# Patient Record
Sex: Female | Born: 1955 | Race: White | Hispanic: No | Marital: Married | Smoking: Never smoker
Health system: Southern US, Community
[De-identification: ages and names within clinical notes are randomized; demographics above are authoritative.]

## PROBLEM LIST (undated history)

## (undated) DIAGNOSIS — M4310 Spondylolisthesis, site unspecified: Secondary | ICD-10-CM

## (undated) DIAGNOSIS — J302 Other seasonal allergic rhinitis: Secondary | ICD-10-CM

## (undated) DIAGNOSIS — M8580 Other specified disorders of bone density and structure, unspecified site: Secondary | ICD-10-CM

## (undated) DIAGNOSIS — K219 Gastro-esophageal reflux disease without esophagitis: Secondary | ICD-10-CM

## (undated) DIAGNOSIS — F419 Anxiety disorder, unspecified: Secondary | ICD-10-CM

## (undated) DIAGNOSIS — E559 Vitamin D deficiency, unspecified: Secondary | ICD-10-CM

## (undated) DIAGNOSIS — E785 Hyperlipidemia, unspecified: Secondary | ICD-10-CM

## (undated) DIAGNOSIS — Z86010 Personal history of colonic polyps: Principal | ICD-10-CM

## (undated) HISTORY — DX: Hyperlipidemia, unspecified: E78.5

## (undated) HISTORY — DX: Gastro-esophageal reflux disease without esophagitis: K21.9

## (undated) HISTORY — PX: TUBAL LIGATION: SHX77

## (undated) HISTORY — DX: Vitamin D deficiency, unspecified: E55.9

## (undated) HISTORY — DX: Other seasonal allergic rhinitis: J30.2

## (undated) HISTORY — DX: Other specified disorders of bone density and structure, unspecified site: M85.80

## (undated) HISTORY — DX: Spondylolisthesis, site unspecified: M43.10

## (undated) HISTORY — PX: WISDOM TOOTH EXTRACTION: SHX21

## (undated) HISTORY — PX: COLONOSCOPY: SHX174

## (undated) HISTORY — DX: Personal history of colonic polyps: Z86.010

---

## 1985-07-10 HISTORY — PX: GYNECOLOGIC CRYOSURGERY: SHX857

## 1988-07-10 HISTORY — PX: TUBAL LIGATION: SHX77

## 1997-10-08 ENCOUNTER — Other Ambulatory Visit: Admission: RE | Admit: 1997-10-08 | Discharge: 1997-10-08 | Payer: Self-pay | Admitting: Gynecology

## 1998-09-27 ENCOUNTER — Encounter: Payer: Self-pay | Admitting: Orthopedic Surgery

## 1998-09-27 ENCOUNTER — Ambulatory Visit (HOSPITAL_COMMUNITY): Admission: RE | Admit: 1998-09-27 | Discharge: 1998-09-27 | Payer: Self-pay | Admitting: Orthopedic Surgery

## 1999-09-06 ENCOUNTER — Other Ambulatory Visit: Admission: RE | Admit: 1999-09-06 | Discharge: 1999-09-06 | Payer: Self-pay | Admitting: Gynecology

## 2000-07-10 HISTORY — PX: BREAST LUMPECTOMY: SHX2

## 2000-07-10 HISTORY — PX: BREAST EXCISIONAL BIOPSY: SUR124

## 2001-01-22 ENCOUNTER — Other Ambulatory Visit: Admission: RE | Admit: 2001-01-22 | Discharge: 2001-01-22 | Payer: Self-pay | Admitting: Gynecology

## 2001-01-28 ENCOUNTER — Encounter (INDEPENDENT_AMBULATORY_CARE_PROVIDER_SITE_OTHER): Payer: Self-pay | Admitting: Specialist

## 2001-01-28 ENCOUNTER — Other Ambulatory Visit: Admission: RE | Admit: 2001-01-28 | Discharge: 2001-01-28 | Payer: Self-pay | Admitting: Gynecology

## 2001-02-15 ENCOUNTER — Encounter: Payer: Self-pay | Admitting: Surgery

## 2001-02-15 ENCOUNTER — Encounter: Admission: RE | Admit: 2001-02-15 | Discharge: 2001-02-15 | Payer: Self-pay | Admitting: General Surgery

## 2001-02-25 ENCOUNTER — Encounter: Payer: Self-pay | Admitting: Surgery

## 2001-02-25 ENCOUNTER — Encounter (INDEPENDENT_AMBULATORY_CARE_PROVIDER_SITE_OTHER): Payer: Self-pay | Admitting: Specialist

## 2001-02-25 ENCOUNTER — Ambulatory Visit (HOSPITAL_BASED_OUTPATIENT_CLINIC_OR_DEPARTMENT_OTHER): Admission: RE | Admit: 2001-02-25 | Discharge: 2001-02-25 | Payer: Self-pay | Admitting: Surgery

## 2001-02-25 ENCOUNTER — Encounter: Admission: RE | Admit: 2001-02-25 | Discharge: 2001-02-25 | Payer: Self-pay | Admitting: Surgery

## 2002-01-23 ENCOUNTER — Other Ambulatory Visit: Admission: RE | Admit: 2002-01-23 | Discharge: 2002-01-23 | Payer: Self-pay | Admitting: Gynecology

## 2002-03-14 ENCOUNTER — Encounter: Payer: Self-pay | Admitting: Gynecology

## 2002-03-14 ENCOUNTER — Encounter: Admission: RE | Admit: 2002-03-14 | Discharge: 2002-03-14 | Payer: Self-pay | Admitting: Gynecology

## 2002-10-23 ENCOUNTER — Encounter: Payer: Self-pay | Admitting: Family Medicine

## 2002-10-23 ENCOUNTER — Encounter: Admission: RE | Admit: 2002-10-23 | Discharge: 2002-10-23 | Payer: Self-pay | Admitting: Family Medicine

## 2003-02-09 ENCOUNTER — Other Ambulatory Visit: Admission: RE | Admit: 2003-02-09 | Discharge: 2003-02-09 | Payer: Self-pay | Admitting: Gynecology

## 2003-05-29 ENCOUNTER — Encounter: Admission: RE | Admit: 2003-05-29 | Discharge: 2003-05-29 | Payer: Self-pay | Admitting: Gynecology

## 2004-05-23 ENCOUNTER — Other Ambulatory Visit: Admission: RE | Admit: 2004-05-23 | Discharge: 2004-05-23 | Payer: Self-pay | Admitting: Gynecology

## 2004-07-13 ENCOUNTER — Encounter: Admission: RE | Admit: 2004-07-13 | Discharge: 2004-07-13 | Payer: Self-pay | Admitting: Gynecology

## 2005-07-28 ENCOUNTER — Encounter: Admission: RE | Admit: 2005-07-28 | Discharge: 2005-07-28 | Payer: Self-pay | Admitting: Gynecology

## 2006-08-02 ENCOUNTER — Encounter: Admission: RE | Admit: 2006-08-02 | Discharge: 2006-08-02 | Payer: Self-pay | Admitting: Gynecology

## 2006-11-26 ENCOUNTER — Ambulatory Visit (HOSPITAL_COMMUNITY): Admission: RE | Admit: 2006-11-26 | Discharge: 2006-11-26 | Payer: Self-pay | Admitting: *Deleted

## 2007-08-06 ENCOUNTER — Encounter: Admission: RE | Admit: 2007-08-06 | Discharge: 2007-08-06 | Payer: Self-pay | Admitting: Gynecology

## 2008-08-12 ENCOUNTER — Encounter: Admission: RE | Admit: 2008-08-12 | Discharge: 2008-08-12 | Payer: Self-pay | Admitting: Gynecology

## 2009-09-09 ENCOUNTER — Encounter: Admission: RE | Admit: 2009-09-09 | Discharge: 2009-09-09 | Payer: Self-pay | Admitting: Gynecology

## 2010-08-22 ENCOUNTER — Other Ambulatory Visit: Payer: Self-pay | Admitting: Gynecology

## 2010-08-22 DIAGNOSIS — Z1231 Encounter for screening mammogram for malignant neoplasm of breast: Secondary | ICD-10-CM

## 2010-09-22 ENCOUNTER — Ambulatory Visit
Admission: RE | Admit: 2010-09-22 | Discharge: 2010-09-22 | Disposition: A | Discharge status: Home / Self Care | Payer: BC Managed Care – PPO | Source: Ambulatory Visit | Attending: Gynecology | Admitting: Gynecology

## 2010-09-22 DIAGNOSIS — Z1231 Encounter for screening mammogram for malignant neoplasm of breast: Secondary | ICD-10-CM

## 2010-11-22 NOTE — Op Note (Signed)
Grace Morales, Grace Morales         ACCOUNT NO.:  [id number]   MEDICAL RECORD NO.:  [id number]          PATIENT TYPE:  AMB   LOCATION:  ENDO                         FACILITY:  [facility]   PHYSICIAN:  Georgiana Spinner, M.D.    DATE OF BIRTH:  1956/06/07   DATE OF PROCEDURE:  DATE OF DISCHARGE:                               OPERATIVE REPORT   PROCEDURE:  Colonoscopy.   INDICATIONS:  Colon cancer screening.   ANESTHESIA:  Fentanyl 150 mcg, 14 mg, 25 mg of Benadryl.   PROCEDURE IN DETAIL:  With the patient mildly sedated in the left  lateral decubitus position, the Pentax videoscopic colonoscope was  inserted into the rectum and passed under direct vision to the cecum  identified by ileocecal valve and appendiceal orifice, both of which  were photographed from this point.  The colonoscope was slowly  withdrawn, taking circumferential views of colonic mucosa, stopping only  in the rectum which appeared normal on direct and showed hemorrhoids on  retroflexed view.  The endoscope was straightened and withdrawn.  The  patient's vital signs and pulse oximeter remained stable.  The patient  tolerated the procedure well without apparent complications.   FINDINGS:  Internal hemorrhoids, otherwise unremarkable examination.   PLAN:  Have the patient follow-up with me as an outpatient in five years  or as needed.           ______________________________  Georgiana Spinner, M.D.     GMO/MEDQ  D:  11/26/2006  T:  11/26/2006  Job:  [id number]

## 2010-11-25 NOTE — Op Note (Signed)
[place]  Patient:    Grace Morales Visit Number: [id number] MRN: [id number]          Service Type: DSU Location: [facility] Attending Physician:  Charlton Haws Proc. Date: 02/24/01 Adm. Date:  [id number]   CC:         [name], M.D.  [facility], Elnita Maxwell A. Radene Journey, M.D.  [facility]   Operative Report  ACCOUNT NO:  [id number]  CCS#:  [id number]  PREOPERATIVE DIAGNOSIS:  Right breast mass.  POSTOPERATIVE DIAGNOSIS:  Right breast mass.  CLINICAL HISTORY:  The patient is a 55 year old with a recently found abnormal mammogram showing an abnormal lesion subareolarly.  It is not thought to be appropriate for core biopsy by the radiologist.  Needle guided excisional biopsy was scheduled.  DESCRIPTION OF PROCEDURE:  The patient was brought to the operating room having had a guidewire placed into the breast.  The lesion spot was marked with an X by the radiologist which was at the 12 oclock position at the areolar margin.  The needle entered about 7 cm from here and about the middle lateral position of the breast and tracked in this general direction.  After satisfactory general anesthesia had been obtained the breast was prepped and draped.  I made a curvilinear incision at the areolar margin from about the 1 oclock to about the 5 oclock position, divided subcutaneous tissues, raised a little bit of a lateral skin flap and then began dividing straight down through breast tissue until I encountered the guidewire as it was tracking through the breast itself.  The guidewire was then manipulated into the wound.  It began to come a little under the guidewire heading medially towards the lesion and I was able to grasp the track of the guidewire with some Allis clamps and excise a core tissue or cylinder around the guidewire. In the subareolar area I did see the lesion itself and I was fairly close to it but it was soft and I  thought most likely to be benign.  Given its location I did feel we could get an adequate wide local excision if it were malignant without doing a subareolar excision and therefore elected to take no further tissue at this time.  The specimen was sent for specimen mammography and a subsequent return to the lesion had been retrieve.  The breast was irrigated and checked for hemostasis and once that was achieved closed with 3-0 Vicryl followed by 4-0 monacryl subcuticular and Steri-Strips on the skin.  I did inject some Marcaine prior to closing to help with postoperative analgesia.  The patient tolerated the procedure well.  There were no complications.  All counts were correct. DD:  02/26/00 TD:  02/26/01 Job: [id number]

## 2011-09-06 ENCOUNTER — Other Ambulatory Visit: Payer: Self-pay | Admitting: Obstetrics & Gynecology

## 2011-09-06 DIAGNOSIS — Z1231 Encounter for screening mammogram for malignant neoplasm of breast: Secondary | ICD-10-CM

## 2011-10-04 ENCOUNTER — Ambulatory Visit
Admission: RE | Admit: 2011-10-04 | Discharge: 2011-10-04 | Disposition: A | Discharge status: Home / Self Care | Payer: BC Managed Care – PPO | Source: Ambulatory Visit | Attending: Obstetrics & Gynecology | Admitting: Obstetrics & Gynecology

## 2011-10-04 DIAGNOSIS — Z1231 Encounter for screening mammogram for malignant neoplasm of breast: Secondary | ICD-10-CM

## 2012-09-25 ENCOUNTER — Encounter (HOSPITAL_BASED_OUTPATIENT_CLINIC_OR_DEPARTMENT_OTHER): Payer: Self-pay

## 2012-09-25 ENCOUNTER — Emergency Department (HOSPITAL_BASED_OUTPATIENT_CLINIC_OR_DEPARTMENT_OTHER)
Admission: EM | Admit: 2012-09-25 | Discharge: 2012-09-25 | Disposition: A | Discharge status: Home / Self Care | Payer: BC Managed Care – PPO | Attending: Emergency Medicine | Admitting: Emergency Medicine

## 2012-09-25 DIAGNOSIS — Z79899 Other long term (current) drug therapy: Secondary | ICD-10-CM | POA: Insufficient documentation

## 2012-09-25 DIAGNOSIS — F411 Generalized anxiety disorder: Secondary | ICD-10-CM | POA: Insufficient documentation

## 2012-09-25 DIAGNOSIS — Z203 Contact with and (suspected) exposure to rabies: Secondary | ICD-10-CM | POA: Insufficient documentation

## 2012-09-25 HISTORY — DX: Anxiety disorder, unspecified: F41.9

## 2012-09-25 MED ORDER — RABIES VACCINE, PCEC IM SUSR
1.0000 mL | Freq: Once | INTRAMUSCULAR | Status: AC
  Administered 2012-09-25: 1 mL via INTRAMUSCULAR
  Filled 2012-09-25: qty 1

## 2012-09-25 MED ORDER — RABIES IMMUNE GLOBULIN 150 UNIT/ML IM INJ
20.0000 [IU]/kg | INJECTION | Freq: Once | INTRAMUSCULAR | Status: AC
  Administered 2012-09-25: 1200 [IU] via INTRAMUSCULAR
  Filled 2012-09-25: qty 16
  Filled 2012-09-25: qty 8
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
## 2012-09-26 NOTE — ED Provider Notes (Signed)
Medical screening examination/treatment/procedure(s) were performed by non-physician practitioner and as supervising physician I was immediately available for consultation/collaboration.    Gerhard Munch, MD 09/26/12 0005

## 2012-09-28 ENCOUNTER — Encounter: Payer: Self-pay | Admitting: *Deleted

## 2012-09-28 ENCOUNTER — Emergency Department (INDEPENDENT_AMBULATORY_CARE_PROVIDER_SITE_OTHER)
Admission: EM | Admit: 2012-09-28 | Discharge: 2012-09-28 | Disposition: A | Discharge status: Home / Self Care | Payer: BC Managed Care – PPO | Source: Home / Self Care

## 2012-09-28 DIAGNOSIS — Z203 Contact with and (suspected) exposure to rabies: Secondary | ICD-10-CM

## 2012-09-28 MED ORDER — RABIES VACCINE, PCEC IM SUSR
1.0000 mL | Freq: Once | INTRAMUSCULAR | Status: AC
  Administered 2012-09-28: 1 mL via INTRAMUSCULAR

## 2012-09-28 NOTE — ED Notes (Signed)
Pt here for rabies shot 2  ?

## 2012-09-30 ENCOUNTER — Telehealth: Payer: Self-pay | Admitting: Obstetrics & Gynecology

## 2012-09-30 NOTE — Telephone Encounter (Signed)
[facility]/PT [facility] NEEDING BONE DENSITY REFERRAL PLEASE/[place]

## 2012-10-01 ENCOUNTER — Telehealth: Payer: Self-pay | Admitting: *Deleted

## 2012-10-01 ENCOUNTER — Other Ambulatory Visit: Payer: Self-pay | Admitting: Obstetrics & Gynecology

## 2012-10-01 ENCOUNTER — Other Ambulatory Visit: Payer: Self-pay

## 2012-10-01 DIAGNOSIS — Z1231 Encounter for screening mammogram for malignant neoplasm of breast: Secondary | ICD-10-CM

## 2012-10-01 DIAGNOSIS — N951 Menopausal and female climacteric states: Secondary | ICD-10-CM

## 2012-10-01 NOTE — Telephone Encounter (Signed)
[facility] Center calling needing order for Bone Density. Please advise . sue

## 2012-10-01 NOTE — Telephone Encounter (Signed)
I sent the order to the breast center.

## 2012-10-01 NOTE — Telephone Encounter (Signed)
PATIENT SCHEDULED FOR BONE DENSITY AND MAMMOGRAM FOR 4/29/2014D @ 11:00    PATIENT SCHEDUELD AT THE [facility] FOR 4/29/2014N @ 1:30pm.  FOR BONE DENSITY AND MAMMOGRAM. SEE FUTURE APPT./ SUE.

## 2012-10-02 ENCOUNTER — Encounter: Payer: Self-pay | Admitting: *Deleted

## 2012-10-02 ENCOUNTER — Emergency Department (INDEPENDENT_AMBULATORY_CARE_PROVIDER_SITE_OTHER)
Admission: EM | Admit: 2012-10-02 | Discharge: 2012-10-02 | Disposition: A | Discharge status: Home / Self Care | Payer: BC Managed Care – PPO | Source: Home / Self Care

## 2012-10-02 DIAGNOSIS — Z203 Contact with and (suspected) exposure to rabies: Secondary | ICD-10-CM

## 2012-10-02 MED ORDER — RABIES VACCINE, PCEC IM SUSR
1.0000 mL | Freq: Once | INTRAMUSCULAR | Status: AC
  Administered 2012-10-02: 1 mL via INTRAMUSCULAR

## 2012-10-02 NOTE — ED Notes (Signed)
Grace Morales is here for her 3rd in series rabies injection.

## 2012-10-09 ENCOUNTER — Emergency Department (INDEPENDENT_AMBULATORY_CARE_PROVIDER_SITE_OTHER)
Admission: EM | Admit: 2012-10-09 | Discharge: 2012-10-09 | Disposition: A | Discharge status: Home / Self Care | Payer: BC Managed Care – PPO | Source: Home / Self Care

## 2012-10-09 DIAGNOSIS — Z203 Contact with and (suspected) exposure to rabies: Secondary | ICD-10-CM

## 2012-10-09 MED ORDER — RABIES VACCINE, PCEC IM SUSR
1.0000 mL | Freq: Once | INTRAMUSCULAR | Status: AC
  Administered 2012-10-09: 1 mL via INTRAMUSCULAR

## 2012-11-05 ENCOUNTER — Ambulatory Visit
Admission: RE | Admit: 2012-11-05 | Discharge: 2012-11-05 | Disposition: A | Discharge status: Home / Self Care | Payer: BC Managed Care – PPO | Source: Ambulatory Visit

## 2012-11-05 ENCOUNTER — Ambulatory Visit
Admission: RE | Admit: 2012-11-05 | Discharge: 2012-11-05 | Disposition: A | Discharge status: Home / Self Care | Payer: BC Managed Care – PPO | Source: Ambulatory Visit | Attending: Obstetrics & Gynecology | Admitting: Obstetrics & Gynecology

## 2012-11-05 DIAGNOSIS — Z1231 Encounter for screening mammogram for malignant neoplasm of breast: Secondary | ICD-10-CM

## 2012-11-05 DIAGNOSIS — N951 Menopausal and female climacteric states: Secondary | ICD-10-CM

## 2012-11-07 ENCOUNTER — Encounter: Payer: Self-pay | Admitting: Obstetrics & Gynecology

## 2012-11-07 ENCOUNTER — Telehealth: Payer: Self-pay | Admitting: Obstetrics & Gynecology

## 2012-11-07 DIAGNOSIS — M8580 Other specified disorders of bone density and structure, unspecified site: Secondary | ICD-10-CM | POA: Insufficient documentation

## 2012-11-08 NOTE — Telephone Encounter (Signed)
Call accepted by [facility]

## 2013-02-10 ENCOUNTER — Ambulatory Visit: Payer: Self-pay | Admitting: Obstetrics and Gynecology

## 2013-02-11 ENCOUNTER — Encounter: Payer: Self-pay | Admitting: Obstetrics & Gynecology

## 2013-02-11 ENCOUNTER — Ambulatory Visit (INDEPENDENT_AMBULATORY_CARE_PROVIDER_SITE_OTHER): Payer: BC Managed Care – PPO | Admitting: Obstetrics & Gynecology

## 2013-02-11 VITALS — BP 108/60 | HR 78 | Resp 16 | Ht 62.5 in | Wt 136.2 lb

## 2013-02-11 DIAGNOSIS — Z01419 Encounter for gynecological examination (general) (routine) without abnormal findings: Secondary | ICD-10-CM

## 2013-02-11 NOTE — Patient Instructions (Addendum)
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
## 2013-10-06 ENCOUNTER — Other Ambulatory Visit: Payer: Self-pay

## 2013-10-06 DIAGNOSIS — Z1231 Encounter for screening mammogram for malignant neoplasm of breast: Secondary | ICD-10-CM

## 2013-11-06 ENCOUNTER — Encounter (INDEPENDENT_AMBULATORY_CARE_PROVIDER_SITE_OTHER): Payer: Self-pay

## 2013-11-06 ENCOUNTER — Ambulatory Visit
Admission: RE | Admit: 2013-11-06 | Discharge: 2013-11-06 | Disposition: A | Discharge status: Home / Self Care | Payer: BC Managed Care – PPO | Source: Ambulatory Visit

## 2013-11-06 DIAGNOSIS — Z1231 Encounter for screening mammogram for malignant neoplasm of breast: Secondary | ICD-10-CM

## 2014-04-29 ENCOUNTER — Telehealth: Payer: Self-pay | Admitting: Obstetrics & Gynecology

## 2014-04-29 NOTE — Telephone Encounter (Signed)
lmtcb re: Dr. Sabra Heck cx her AEX due to conflicting surgery.

## 2014-05-05 ENCOUNTER — Ambulatory Visit (INDEPENDENT_AMBULATORY_CARE_PROVIDER_SITE_OTHER): Payer: BC Managed Care – PPO | Admitting: Nurse Practitioner

## 2014-05-05 ENCOUNTER — Ambulatory Visit: Payer: BC Managed Care – PPO | Admitting: Obstetrics & Gynecology

## 2014-05-05 ENCOUNTER — Encounter: Payer: Self-pay | Admitting: Nurse Practitioner

## 2014-05-05 VITALS — BP 102/62 | HR 72 | Ht 62.5 in | Wt 142.0 lb

## 2014-05-05 DIAGNOSIS — Z79890 Hormone replacement therapy (postmenopausal): Secondary | ICD-10-CM

## 2014-05-05 DIAGNOSIS — Z1211 Encounter for screening for malignant neoplasm of colon: Secondary | ICD-10-CM

## 2014-05-05 DIAGNOSIS — Z0000 Encounter for general adult medical examination without abnormal findings: Secondary | ICD-10-CM

## 2014-05-05 DIAGNOSIS — F418 Other specified anxiety disorders: Secondary | ICD-10-CM

## 2014-05-05 DIAGNOSIS — R87619 Unspecified abnormal cytological findings in specimens from cervix uteri: Secondary | ICD-10-CM

## 2014-05-05 DIAGNOSIS — Z01419 Encounter for gynecological examination (general) (routine) without abnormal findings: Secondary | ICD-10-CM

## 2014-05-05 LAB — POCT URINALYSIS DIPSTICK
Bilirubin, UA: NEGATIVE
Blood, UA: NEGATIVE
Glucose, UA: NEGATIVE
Ketones, UA: NEGATIVE
Leukocytes, UA: NEGATIVE
Nitrite, UA: NEGATIVE
Protein, UA: NEGATIVE
Urobilinogen, UA: NEGATIVE
pH, UA: 5

## 2014-05-05 NOTE — Progress Notes (Signed)
58 y.o. G0 Married Caucasian Fe here for annual exam.  Off HRT spring of 2012.  PMB 07/2011 with negative work up.  Feels well, no new health problems.  Patient's last menstrual period was 07/11/2011.          Sexually active: Yes.    The current method of family planning is tubal ligation.    Exercising: Yes.    Home exercise routine includes cardio and weight resistence. Smoker:  no  Health Maintenance: Pap:  01/29/12 WNL neg HR HPV MMG:  11/07/13 Bi-Rads Neg Colonoscopy:  2008 repeat in 10 years, Dr. Lajoyce Corners BMD:   4/14, -1.1/-1.2 TDaP:  Up to date with Dr. Minna Antis Labs: Dr. Minna Antis, 5/15         UA: neg, ph: 5.0   reports that she has never smoked. She has never used smokeless tobacco. She reports that she drinks about 1.5 ounces of alcohol per week. She reports that she does not use illicit drugs.  Past Medical History  Diagnosis Date  . Anxiety     Past Surgical History  Procedure Laterality Date  . Breast lumpectomy      right breast benign  . Tubal ligation    . Gynecologic cryosurgery  1987    Current Outpatient Prescriptions  Medication Sig Dispense Refill  . BuPROPion HCl (WELLBUTRIN PO) Take 150 mg by mouth daily.       . calcium carbonate (TUMS EX) 750 MG chewable tablet Chew 1 tablet by mouth daily.      . Cholecalciferol (VITAMIN D PO) Take 4,000 Int'l Units by mouth.       . Loratadine (CLARITIN PO) Take by mouth as needed.        No current facility-administered medications for this visit.    Family History  Problem Relation Age of Onset  . Diabetes Maternal Grandfather   . Diabetes Father     controlled by diet  . Breast cancer Mother 75  . Breast cancer Paternal Grandmother 54    had mastectomy in 1940 or 1950 ? if secondary to cancer  . Hypertension Father     ?  Marland Kitchen Heart disease Father     bypass surgery and pacemaker  . Osteoporosis Mother   . Colon cancer Paternal Grandfather 7    ROS:  Pertinent items are noted in HPI.  Otherwise, a comprehensive ROS  was negative.  Exam:   BP 102/62  Pulse 72  Ht 5' 2.5" (1.588 m)  Wt 142 lb (64.411 kg)  BMI 25.54 kg/m2  LMP 07/11/2011 Height: 5' 2.5" (158.8 cm)  Ht Readings from Last 3 Encounters:  05/05/14 5' 2.5" (1.588 m)  02/11/13 5' 2.5" (1.588 m)  09/25/12 5\' 3"  (1.6 m)    General appearance: alert, cooperative and appears stated age Head: Normocephalic, without obvious abnormality, atraumatic Neck: no adenopathy, supple, symmetrical, trachea midline and thyroid normal to inspection and palpation Lungs: clear to auscultation bilaterally Breasts: normal appearance, no masses or tenderness Heart: regular rate and rhythm Abdomen: soft, non-tender; no masses,  no organomegaly Extremities: extremities normal, atraumatic, no cyanosis or edema Skin: Skin color, texture, turgor normal. No rashes or lesions Lymph nodes: Cervical, supraclavicular, and axillary nodes normal. No abnormal inguinal nodes palpated Neurologic: Grossly normal   Pelvic: External genitalia:  no lesions              Urethra:  normal appearing urethra with no masses, tenderness or lesions  Bartholin's and Skene's: normal                 Vagina: normal appearing vagina with normal color and discharge, no lesions              Cervix: anteverted              Pap taken: Yes.   Bimanual Exam:  Uterus:  normal size, contour, position, consistency, mobility, non-tender              Adnexa: no mass, fullness, tenderness               Rectovaginal: Confirms               Anus:  normal sphincter tone, no lesions  A:  Well Woman with normal exam  Postmenopausal off HRT spring 2012  History of PMB with negative evaluation 07/2011  Remote history of abnormal pap with cryo 03/1986  [facility]: mother with breast cancer age 78  Mild osteopenia  P:   Reviewed health and wellness pertinent to exam  Pap smear taken today  Mammogram is due 5/16  IFOB is given  Counseled on breast self exam, mammography screening, adequate  intake of calcium and vitamin D, diet and exercise, Kegel's exercises return annually or prn  An After Visit Summary was printed and given to the patient.

## 2014-05-05 NOTE — Patient Instructions (Signed)

## 2014-05-08 LAB — IPS PAP TEST WITH HPV

## 2014-05-08 NOTE — Progress Notes (Signed)
Encounter reviewed by Dr. Vishaal Strollo Silva.  

## 2014-05-14 LAB — FECAL OCCULT BLOOD, IMMUNOCHEMICAL: IFOBT: NEGATIVE

## 2014-05-14 NOTE — Addendum Note (Signed)
Addended by: Abelino Derrick C on: 05/14/2014 10:31 AM   Modules accepted: Orders

## 2014-07-10 HISTORY — PX: COLONOSCOPY: SHX174

## 2014-10-19 ENCOUNTER — Other Ambulatory Visit: Payer: Self-pay

## 2014-10-19 DIAGNOSIS — Z1231 Encounter for screening mammogram for malignant neoplasm of breast: Secondary | ICD-10-CM

## 2014-11-09 ENCOUNTER — Ambulatory Visit: Payer: BC Managed Care – PPO

## 2014-11-11 ENCOUNTER — Telehealth: Payer: Self-pay | Admitting: Internal Medicine

## 2014-11-23 ENCOUNTER — Ambulatory Visit
Admission: RE | Admit: 2014-11-23 | Discharge: 2014-11-23 | Disposition: A | Discharge status: Home / Self Care | Payer: BC Managed Care – PPO | Source: Ambulatory Visit

## 2014-11-23 DIAGNOSIS — Z1231 Encounter for screening mammogram for malignant neoplasm of breast: Secondary | ICD-10-CM

## 2014-11-23 IMAGING — MG MM SCREENING BREAST TOMO BILATERAL
9 series · 9 of 25 positions shown · non-contrast
Comparison: Previous exam(s).

CLINICAL DATA: Screening.

EXAM:
DIGITAL SCREENING BILATERAL MAMMOGRAM WITH 3D TOMO WITH CAD

[L MLO (1 of 2)]
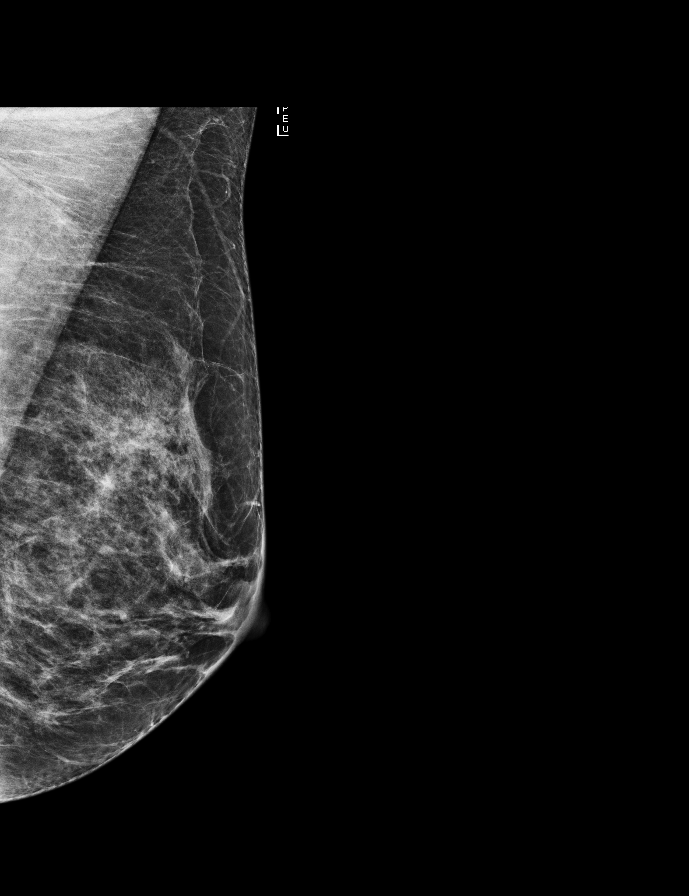

[R CC]
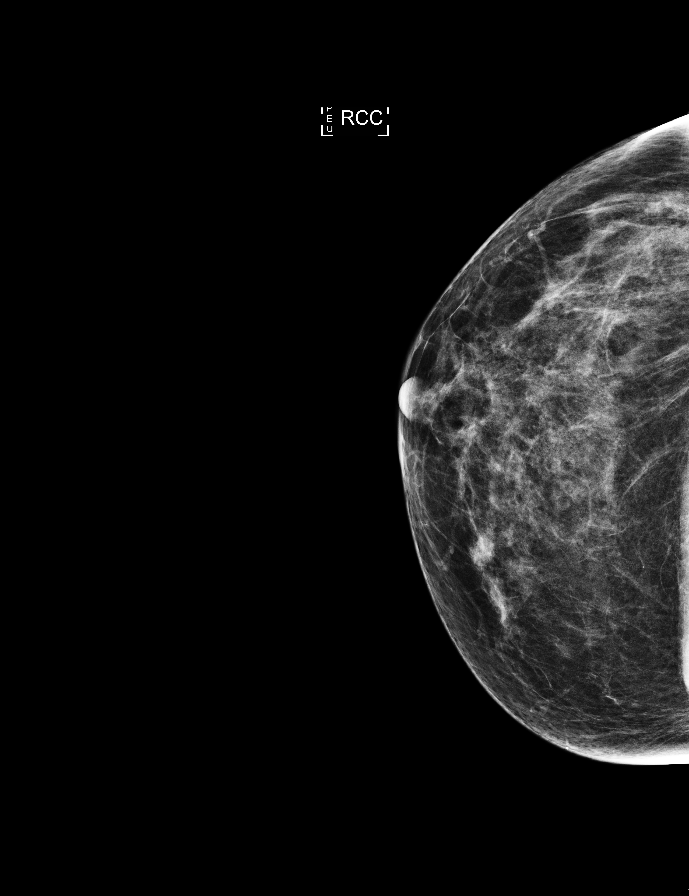

[R MLO]
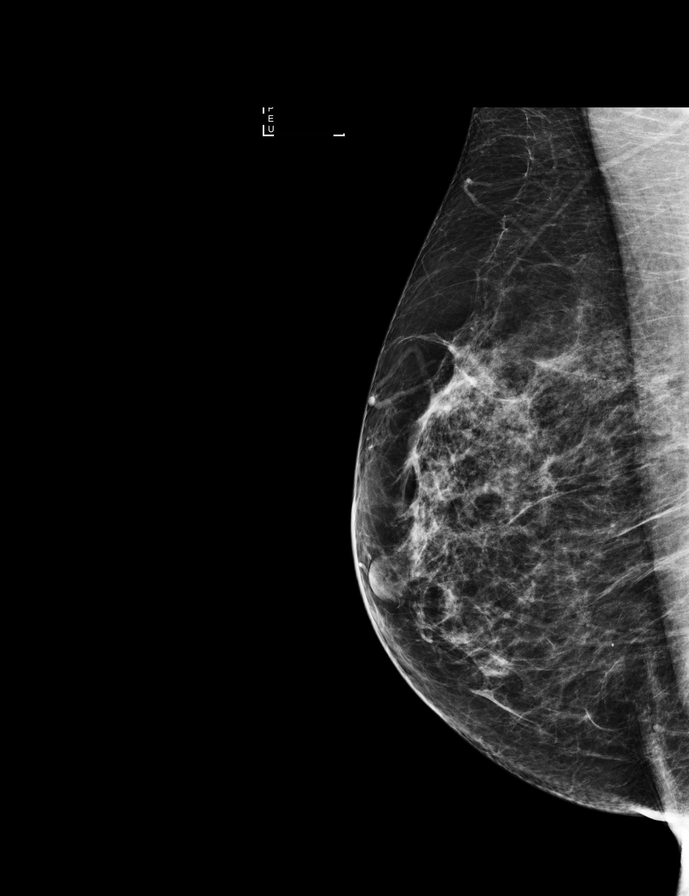

[L CC]
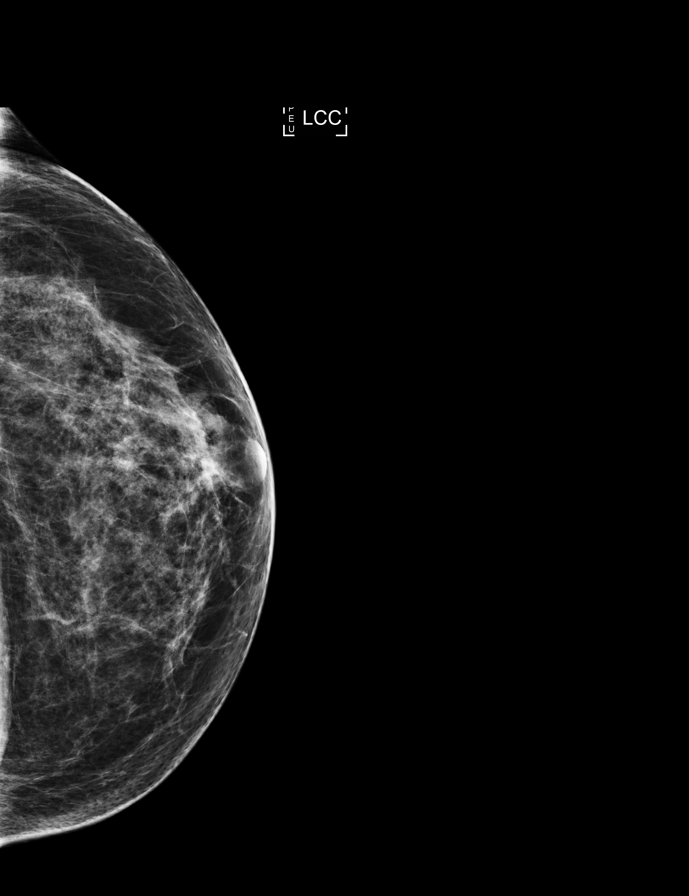

[L MLO (2 of 2)]
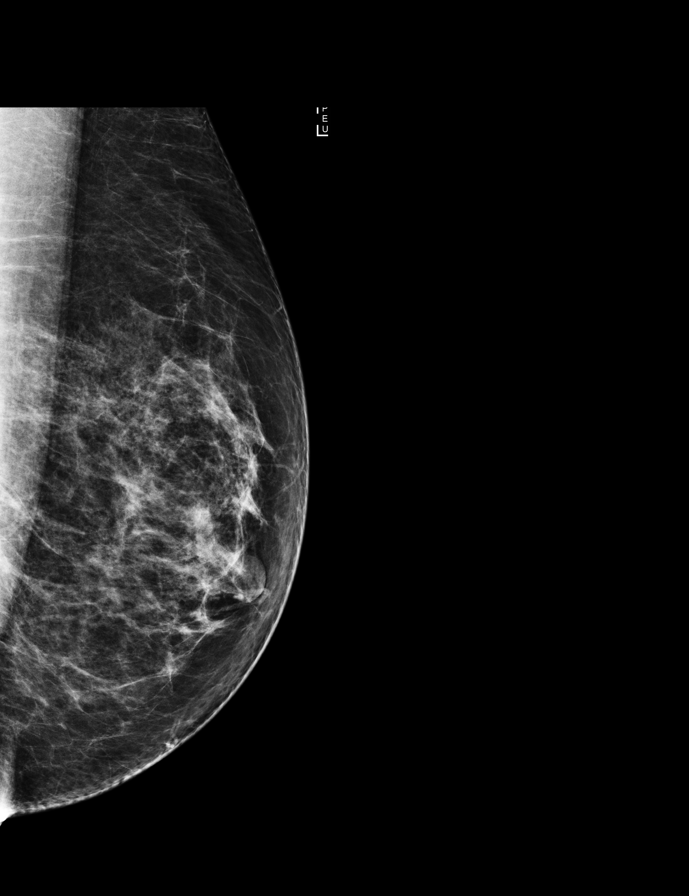

[R CC tomo · tomo slice 35/68.0]
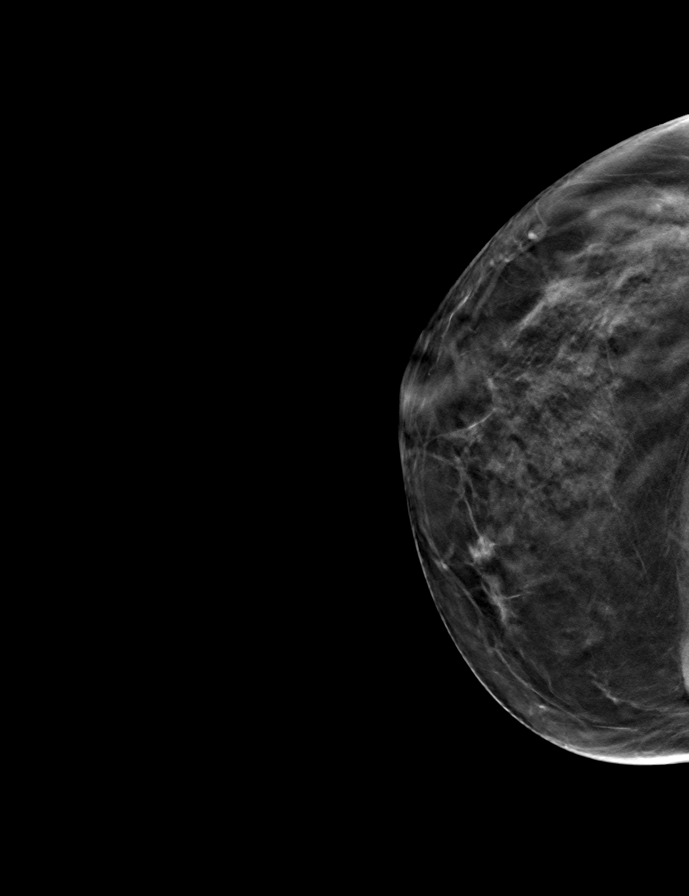

[R MLO tomo · tomo slice 33/64.0]
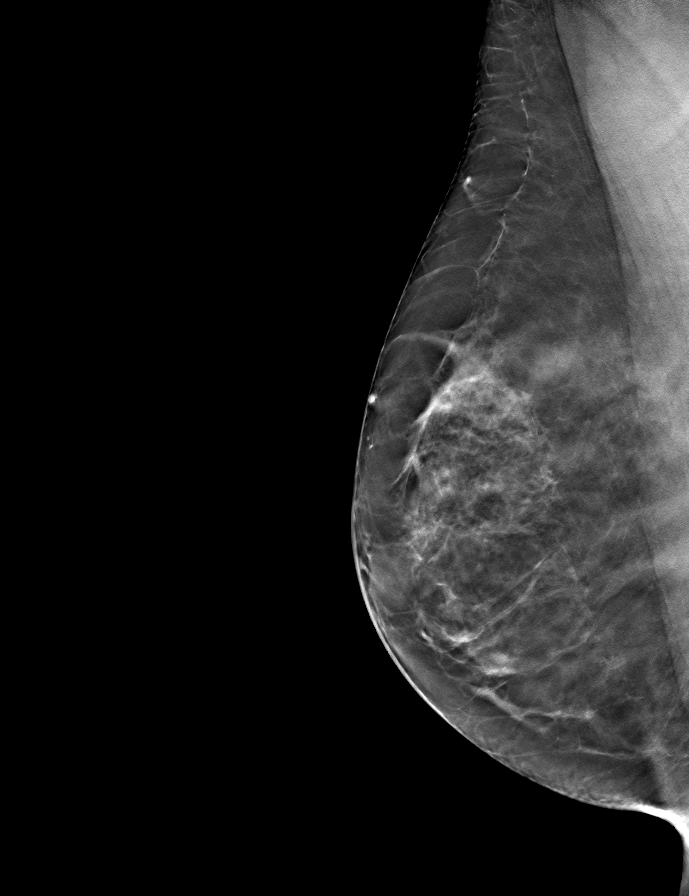

[L MLO tomo · tomo slice 33/66.0]
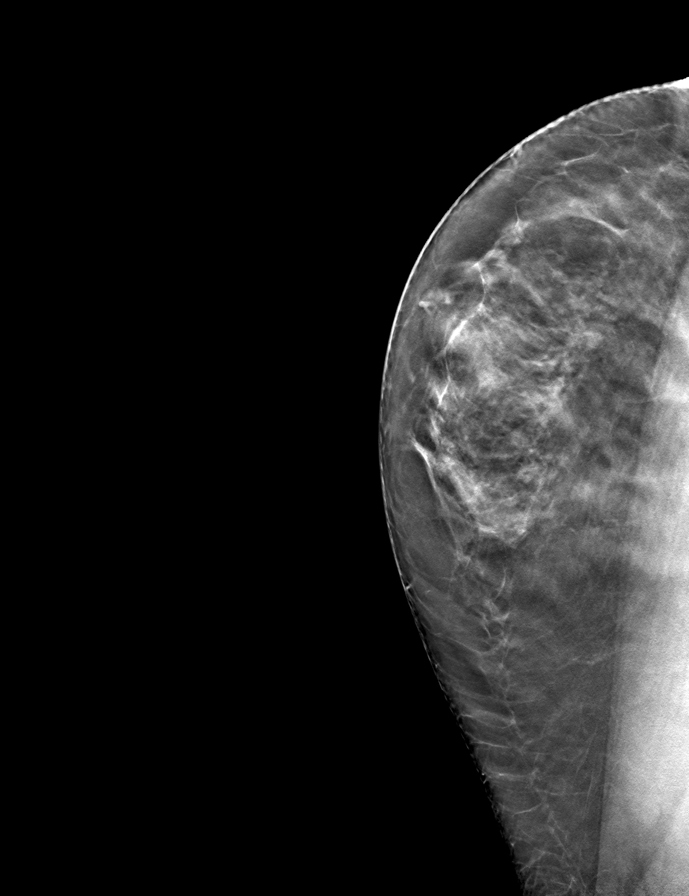

[L CC tomo · tomo slice 33/65.0]
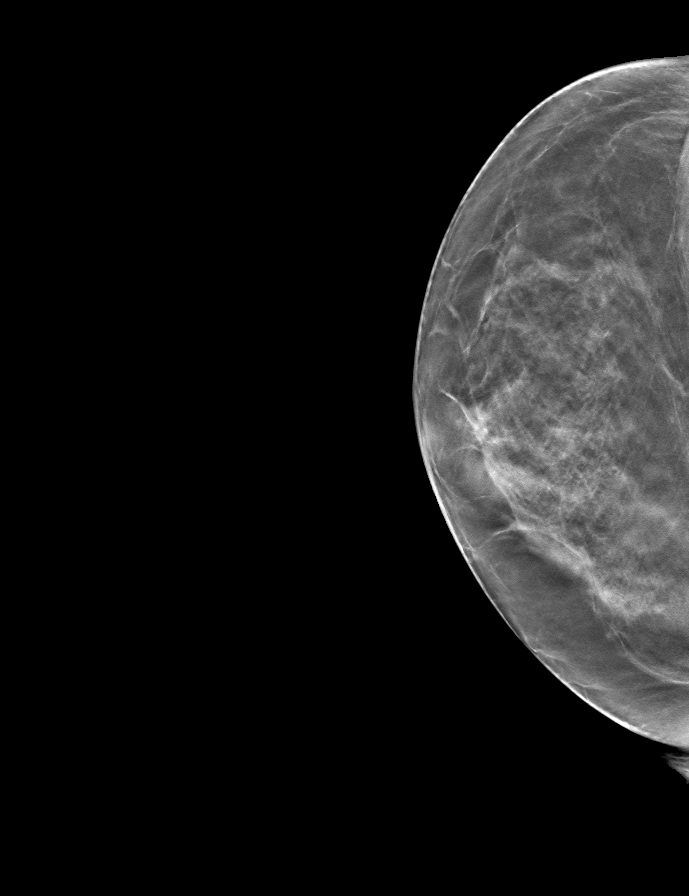

[9 of 25 positions shown; findings below may reference images not displayed]

ACR Breast Density Category c: The breast tissue is heterogeneously
dense, which may obscure small masses.
FINDINGS: There are no findings suspicious for malignancy. Images were
processed with CAD.
IMPRESSION: No mammographic evidence of malignancy. A result letter of this
screening mammogram will be mailed directly to the patient.

RECOMMENDATION:
Screening mammogram in one year. (Code:[SM])

BI-RADS CATEGORY  1: Negative.

## 2014-11-30 NOTE — Telephone Encounter (Signed)
Dr. Carlean Purl reviewed records.  Says pt is due for colon 11/2016.  Recall put in system.  Pt advised if any GI issues, we could see her for OV.  Pt not having any issues.  Will call back to sched OV if needed.

## 2014-12-02 ENCOUNTER — Encounter: Payer: Self-pay | Admitting: Internal Medicine

## 2014-12-02 ENCOUNTER — Telehealth: Payer: Self-pay | Admitting: Internal Medicine

## 2014-12-02 NOTE — Telephone Encounter (Signed)
I reviewed a request for a colonoscopy sent by Dr. Minna Antis - mother just dx w/ colon cancer in 80's. Looked at records again. I failed to see initially that she has had some changes in bowels with thin stools etc. Also some rectal pain. In PCP notes.  So my original recommendation of waiting until 10 yrs from last colonoscopy has changed - she could just need to see me in the office vs scheduling a colonoscopy. Possibly anorectal problems.  From what I see (now) reasonable to do a colonoscopy though it would be a diagnostic one and not screening which can change costs to her (increase). If still having the changes in her bowels scheduling a colonoscopy sensible - direct ok.   If she wants me to call her I will at some point.

## 2014-12-09 NOTE — Telephone Encounter (Signed)
Spoke with patient and she reports having rectal pain only twice so she prefers to talk to Dr. Carlean Purl on the phone before setting up either an office visit or direct colon.  No issues currently.  Home number is best to reach her as cell doesn't have good reception.

## 2014-12-09 NOTE — Telephone Encounter (Signed)
Left message to call me back.

## 2015-01-06 ENCOUNTER — Encounter: Payer: Self-pay | Admitting: Internal Medicine

## 2015-01-06 NOTE — Telephone Encounter (Signed)
I have called several times and unable to reach her. Will send a letter re: schedule visit if desired to discuss bowel changes or call back and let me know when and how best to reach her

## 2015-01-07 NOTE — Telephone Encounter (Signed)
Left detailed message for patient to call me back.

## 2015-01-07 NOTE — Telephone Encounter (Signed)
Pt said she is returning Dr. Celesta Aver call from yesterday

## 2015-01-08 NOTE — Telephone Encounter (Signed)
Patient wanted me to relay the following information to you so you could decide if you need to see her.  First she would be glad to do her colonoscopy earlier than her recall.  Her insurance will pay 100% if its screening.  Onset in May with a "heavy feeling in her bottom" she thinks could have been constipation.  She took her husband's Miralax for 4 days and had pencil thin bowel movement , saw Dr. Minna Antis who did hemmocult test which was negative.  Currently bowel movements are normal, no blood and she's not in any pain.  She said she has only had 2 episodes in her life of that heavy bottom feeling and both episodes lasted about a week.  If we try to reach her the home # is best and ok to leave message.  She was sorry she missed your calls, they have been out of town.

## 2015-01-08 NOTE — Telephone Encounter (Signed)
OKset up a screening colon then please - direct Colon cancer screening dx

## 2015-01-08 NOTE — Telephone Encounter (Signed)
Left patient detailed message that we can do a direct screening colonoscopy and for her to check her schedule and call us to set up.

## 2015-01-12 ENCOUNTER — Encounter: Payer: Self-pay | Admitting: Internal Medicine

## 2015-03-05 ENCOUNTER — Ambulatory Visit (AMBULATORY_SURGERY_CENTER): Payer: Self-pay

## 2015-03-05 ENCOUNTER — Encounter: Payer: Self-pay | Admitting: Internal Medicine

## 2015-03-05 VITALS — Ht 62.75 in | Wt 146.0 lb

## 2015-03-05 DIAGNOSIS — Z800 Family history of malignant neoplasm of digestive organs: Secondary | ICD-10-CM
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
## 2015-03-29 ENCOUNTER — Ambulatory Visit (AMBULATORY_SURGERY_CENTER): Payer: BC Managed Care – PPO | Admitting: Internal Medicine

## 2015-03-29 ENCOUNTER — Encounter: Payer: Self-pay | Admitting: Internal Medicine

## 2015-03-29 VITALS — BP 123/71 | HR 63 | Temp 98.2°F | Resp 27 | Ht 62.75 in | Wt 146.0 lb

## 2015-03-29 DIAGNOSIS — Z1211 Encounter for screening for malignant neoplasm of colon: Secondary | ICD-10-CM | POA: Diagnosis present

## 2015-03-29 DIAGNOSIS — D120 Benign neoplasm of cecum: Secondary | ICD-10-CM

## 2015-03-29 DIAGNOSIS — Z800 Family history of malignant neoplasm of digestive organs: Secondary | ICD-10-CM

## 2015-03-29 MED ORDER — SODIUM CHLORIDE 0.9 % IV SOLN: 500.0000 mL | INTRAVENOUS | Status: DC

## 2015-03-29 NOTE — Patient Instructions (Addendum)
I found and removed one tiny polyp today. I will let you know pathology results and when to have another routine colonoscopy by mail.  I appreciate the opportunity to care for you. Gatha Mayer, MD, FACG   YOU HAD AN ENDOSCOPIC PROCEDURE TODAY AT [facility]:   Refer to the procedure report that was given to you for any specific questions about what was found during the examination.  If the procedure report does not answer your questions, please call your gastroenterologist to clarify.  If you requested that your care partner not be given the details of your procedure findings, then the procedure report has been included in a sealed envelope for you to review at your convenience later.  YOU SHOULD EXPECT: Some feelings of bloating in the abdomen. Passage of more gas than usual.  Walking can help get rid of the air that was put into your GI tract during the procedure and reduce the bloating. If you had a lower endoscopy (such as a colonoscopy or flexible sigmoidoscopy) you may notice spotting of blood in your stool or on the toilet paper. If you underwent a bowel prep for your procedure, you may not have a normal bowel movement for a few days.  Please Note:  You might notice some irritation and congestion in your nose or some drainage.  This is from the oxygen used during your procedure.  There is no need for concern and it should clear up in a day or so.  SYMPTOMS TO REPORT IMMEDIATELY:   Following lower endoscopy (colonoscopy or flexible sigmoidoscopy):  Excessive amounts of blood in the stool  Significant tenderness or worsening of abdominal pains  Swelling of the abdomen that is new, acute  Fever of 100F or higher   For urgent or emergent issues, a gastroenterologist can be reached at any hour by calling [phone].   DIET: Your first meal following the procedure should be a small meal and then it is ok to progress to your normal diet. Heavy or fried  foods are harder to digest and may make you feel nauseous or bloated.  Likewise, meals heavy in dairy and vegetables can increase bloating.  Drink plenty of fluids but you should avoid alcoholic beverages for 24 hours.  ACTIVITY:  You should plan to take it easy for the rest of today and you should NOT DRIVE or use heavy machinery until tomorrow (because of the sedation medicines used during the test).    FOLLOW UP: Our staff will call the number listed on your records the next business day following your procedure to check on you and address any questions or concerns that you may have regarding the information given to you following your procedure. If we do not reach you, we will leave a message.  However, if you are feeling well and you are not experiencing any problems, there is no need to return our call.  We will assume that you have returned to your regular daily activities without incident.  Read all of the handouts given to you by your recovery room nurse.  If any biopsies were taken you will be contacted by phone or by letter within the next 1-3 weeks.  Please call us at [phone] if you have not heard about the biopsies in 3 weeks.    SIGNATURES/CONFIDENTIALITY: You and/or your care partner have signed paperwork which will be entered into your electronic medical record.  These signatures attest to the fact that that  the information above on your After Visit Summary has been reviewed and is understood.  Full responsibility of the confidentiality of this discharge information lies with you and/or your care-partner.

## 2015-03-29 NOTE — Progress Notes (Signed)
Called to room to assist during endoscopic procedure.  Patient ID and intended procedure confirmed with present staff. Received instructions for my participation in the procedure from the performing physician.  

## 2015-03-29 NOTE — Op Note (Signed)
[facility]  [address] [place], [address]   COLONOSCOPY PROCEDURE REPORT  PATIENT: Grace Morales, Grace Morales  MR#: [id number] BIRTHDATE: 09-Mar-1956 , 37  yrs. old GENDER: female ENDOSCOPIST: Gatha Mayer, MD, [facility] PROCEDURE DATE:  03/29/2015 PROCEDURE:   Colonoscopy, screening and Colonoscopy with snare polypectomy First Screening Colonoscopy - Avg.  risk and is 50 yrs.  old or older - No.  Prior Negative Screening - Now for repeat screening. Less than 10 yrs Prior Negative Screening - Now for repeat screening.  Above average risk  History of Adenoma - Now for follow-up colonoscopy & has been > or = to 3 yrs.  N/A  Polyps removed today? Yes ASA CLASS:   Class I INDICATIONS:Screening for colonic neoplasia and FH Colon or Rectal Adenocarcinoma. MEDICATIONS: Propofol 300 mg IV and Monitored anesthesia care  DESCRIPTION OF PROCEDURE:   After the risks benefits and alternatives of the procedure were thoroughly explained, informed consent was obtained.  The digital rectal exam revealed no abnormalities of the rectum.   The LB [id number]  endoscope was introduced through the anus and advanced to the cecum, which was identified by both the appendix and ileocecal valve. No adverse events experienced.   The quality of the prep was good.  (MiraLax was used)  The instrument was then slowly withdrawn as the colon was fully examined. Estimated blood loss is zero unless otherwise noted in this procedure report.      COLON FINDINGS: A smooth sessile polyp measuring 5 mm in size was found at the cecum.  A polypectomy was performed with a cold snare. The resection was complete, the polyp tissue was completely retrieved and sent to histology. Colon otherwies normal. Retroflexed views revealed no abnormalities. The time to cecum = 2.1 Withdrawal time = 10.3   The scope was withdrawn and the procedure completed. COMPLICATIONS: There were no immediate  complications.  ENDOSCOPIC IMPRESSION: Sessile polyp was found at the cecum; polypectomy was performed with a cold snare - otherwise normal Family hx CrCA elderly mother and elderly paternal grandfather  RECOMMENDATIONS: Timing of repeat colonoscopy will be determined by pathology findings.  eSigned:  Gatha Mayer, MD, [facility] 03/29/2015 9:12 AM   cc: Lenna Sciara, MD and The Patient

## 2015-03-29 NOTE — Progress Notes (Signed)
Transferred to recovery room. A/O x3, pleased with MAC.  VSS.  Report to Suzanne, RN. 

## 2015-03-30 ENCOUNTER — Telehealth: Payer: Self-pay | Admitting: *Deleted

## 2015-03-30 NOTE — Telephone Encounter (Signed)
  Follow up Call-  No answer, left message to call if questions or concerns.    

## 2015-04-06 ENCOUNTER — Encounter: Payer: Self-pay | Admitting: Internal Medicine

## 2015-04-06 DIAGNOSIS — Z860100 Personal history of colon polyps, unspecified: Secondary | ICD-10-CM | POA: Insufficient documentation

## 2015-04-06 HISTORY — DX: Personal history of colon polyps, unspecified: Z86.0100

## 2015-04-06 HISTORY — DX: Personal history of colonic polyps: Z86.010

## 2015-04-06 NOTE — Progress Notes (Signed)
Quick Note:  5 mm sessile serrated polyp w/o dysplasia Repeat colonoscopy 2021 ______

## 2015-07-15 ENCOUNTER — Encounter: Payer: Self-pay | Admitting: Obstetrics & Gynecology

## 2015-07-15 ENCOUNTER — Ambulatory Visit (INDEPENDENT_AMBULATORY_CARE_PROVIDER_SITE_OTHER): Payer: BC Managed Care – PPO | Admitting: Obstetrics & Gynecology

## 2015-07-15 VITALS — BP 126/80 | HR 90 | Resp 16 | Ht 62.5 in | Wt 146.0 lb

## 2015-07-15 DIAGNOSIS — Z124 Encounter for screening for malignant neoplasm of cervix: Secondary | ICD-10-CM | POA: Diagnosis not present

## 2015-07-15 DIAGNOSIS — Z01419 Encounter for gynecological examination (general) (routine) without abnormal findings: Secondary | ICD-10-CM | POA: Diagnosis not present

## 2015-07-15 MED ORDER — BUPROPION HCL ER (XL) 150 MG PO TB24
150.0000 mg | ORAL_TABLET | Freq: Every day | ORAL | Status: DC
Start: 2015-07-15 — End: 2019-01-24

## 2015-07-15 MED ORDER — TEMAZEPAM 15 MG PO CAPS
ORAL_CAPSULE | ORAL | Status: DC
Start: 2015-07-15 — End: 2016-10-31

## 2015-07-15 NOTE — Patient Instructions (Addendum)
Dr. Colin Benton, [place] at [facility] (docusate sodium) 100mg  take up to four daily

## 2015-07-15 NOTE — Progress Notes (Signed)
60 y.o. G0P0 MarriedCaucasianF here for annual exam.  Doing well.  Had colonoscopy in September due to family hx change.  She did have a sessile polyp.  Has follow up in five years scheduled.  Mother was diagnosed with colon cancer last year and her mother died a month ago.  Pt very sad but appropriate.  Pt reports mother was tested for Lynch syndrome due to atypical nature of colon cancer and "blood disorder".  Mother was followed by Dr. Burr Medico at the [facility].  Pt reports her mother just had a "horrible year".  Pt just feels at a loss of what to do since her mother's care took up all of her time this year.    Pt is starting with hospice counseling.  Pt reports having issues with sleep right now.  Did see Dr. Noah Delaine.  Was treated with Clonazepam.     Patient's last menstrual period was 07/11/2011.          Sexually active: Yes.    The current method of family planning is post menopausal status.    Exercising: Yes.    Cardio, weights Smoker:  no  Health Maintenance: Pap:  05/05/14 Neg. HR HPV:neg History of abnormal Pap:  Yes, h/o cryo 1987 MMG:  11/24/14 BIRADS1:neg Colonoscopy:  03/2015 polyps  BMD:   11/05/12 Mild Osteopenia  TDaP:  W/ PCP Screening Labs: PCP, Hb today: PCP, Urine today: PCP   reports that she has never smoked. She has never used smokeless tobacco. She reports that she drinks about 1.8 oz of alcohol per week. She reports that she does not use illicit drugs.  Past Medical History  Diagnosis Date  . Anxiety   . Vitamin D deficiency   . Spondylolisthesis   . Hx of colonic polyp - sessile serrated polyp 04/06/2015    Past Surgical History  Procedure Laterality Date  . Breast lumpectomy      right breast benign  . Tubal ligation    . Gynecologic cryosurgery  1987  . Colonoscopy      Current Outpatient Prescriptions  Medication Sig Dispense Refill  . BuPROPion HCl (WELLBUTRIN PO) Take 150 mg by mouth daily.     . Cholecalciferol (VITAMIN D PO) Take 4,000 Int'l  Units by mouth.     . clonazePAM (KLONOPIN) 1 MG tablet Take 1 tablet by mouth daily as needed.  2  . ibuprofen (ADVIL,MOTRIN) 100 MG chewable tablet Chew 200 mg by mouth every 8 (eight) hours as needed (1-2 tabs po daily as needed).    . Loratadine (CLARITIN PO) Take by mouth as needed.     . Misc Natural Products (ESTROVEN ENERGY) TABS Take by mouth daily.     No current facility-administered medications for this visit.    Family History  Problem Relation Age of Onset  . Diabetes Maternal Grandfather   . Diabetes Father     controlled by diet  . Hypertension Father     ?  Marland Kitchen Heart disease Father     bypass surgery and pacemaker  . Breast cancer Mother 53  . Osteoporosis Mother   . Colon cancer Mother   . Breast cancer Paternal Grandmother 72    had mastectomy in 1940 or 1950 ? if secondary to cancer  . Colon cancer Paternal Grandfather 70    ROS:  Pertinent items are noted in HPI.  Otherwise, a comprehensive ROS was negative.  Exam:   BP 126/80 mmHg  Pulse 90  Resp 16  Ht  5' 2.5" (1.588 m)  Wt 146 lb (66.225 kg)  BMI 26.26 kg/m2  LMP 07/11/2011  Weight change: -4#   Height: 5' 2.5" (158.8 cm)  Ht Readings from Last 3 Encounters:  07/15/15 5' 2.5" (1.588 m)  03/29/15 5' 2.75" (1.594 m)  03/05/15 5' 2.75" (1.594 m)    General appearance: alert, cooperative and appears stated age Head: Normocephalic, without obvious abnormality, atraumatic Neck: no adenopathy, supple, symmetrical, trachea midline and thyroid normal to inspection and palpation Lungs: clear to auscultation bilaterally Breasts: normal appearance, no masses or tenderness Heart: regular rate and rhythm Abdomen: soft, non-tender; bowel sounds normal; no masses,  no organomegaly Extremities: extremities normal, atraumatic, no cyanosis or edema Skin: Skin color, texture, turgor normal. No rashes or lesions Lymph nodes: Cervical, supraclavicular, and axillary nodes normal. No abnormal inguinal nodes  palpated Neurologic: Grossly normal   Pelvic: External genitalia:  no lesions              Urethra:  normal appearing urethra with no masses, tenderness or lesions              Bartholins and Skenes: normal                 Vagina: normal appearing vagina with normal color and discharge, no lesions              Cervix: no lesions              Pap taken: yes Bimanual Exam:  Uterus:  normal size, contour, position, consistency, mobility, non-tender              Adnexa: normal adnexa and no mass, fullness, tenderness               Rectovaginal: Confirms               Anus:  normal sphincter tone, no lesions  Chaperone was present for exam.  A:  Well Woman with normal exam Postmenopausal off HRT since 89 History of PMB with negative evaluation 07/2011 Remote history of abnormal pap with cryo 1986/04/01  Mother died 2023-07-16 due to blood disorder, h/o colon cancer diagnosed 12/15, and h/o breast cancer  Mild osteopenia  H/O constipation  Appropriate grief reaction  P: MMG yearly Pap smear taken today.  Neg pap with neg HR HPV 2015.  New PCP names given  Colace for constipation discussed  Pt starting counseling with Hospice tomorrow  Trial of Temazepam 15mg  1-2 qhs prn insomnia.  #45/1RF.  Pt recognizes this is situational and will not plan to use this long term. Return annually or prn

## 2015-07-19 LAB — IPS PAP TEST WITH REFLEX TO HPV

## 2015-09-10 ENCOUNTER — Telehealth: Payer: Self-pay

## 2015-09-10 NOTE — Telephone Encounter (Signed)
Spoke with patient. Advised we have received notification that her insurance will only cover Temazepam 15 mg #45 every 75 days. Patient states she is using around 20 capsules per month and does not need any more than prescribed amount. Advised if at any point she feels she needs more than 45 capsules in 2.5 months she will need to be referred to a specialist. She is agreeable. Form from insurance company sent for scan.  Routing to provider for final review. Patient agreeable to disposition. Will close encounter.

## 2015-10-29 ENCOUNTER — Encounter: Payer: Self-pay | Admitting: *Deleted

## 2015-10-29 ENCOUNTER — Emergency Department
Admission: EM | Admit: 2015-10-29 | Discharge: 2015-10-29 | Disposition: A | Discharge status: Home / Self Care | Payer: BC Managed Care – PPO | Source: Home / Self Care | Attending: Family Medicine | Admitting: Family Medicine

## 2015-10-29 ENCOUNTER — Other Ambulatory Visit: Payer: Self-pay | Admitting: Family Medicine

## 2015-10-29 DIAGNOSIS — W57XXXA Bitten or stung by nonvenomous insect and other nonvenomous arthropods, initial encounter: Secondary | ICD-10-CM

## 2015-10-29 DIAGNOSIS — T148 Other injury of unspecified body region: Secondary | ICD-10-CM | POA: Diagnosis not present

## 2015-10-29 MED ORDER — DOXYCYCLINE HYCLATE 100 MG PO CAPS: 100.0000 mg | ORAL_CAPSULE | Freq: Two times a day (BID) | ORAL | Status: DC

## 2015-10-29 NOTE — ED Provider Notes (Signed)
CSN: [id number]     Arrival date & time 10/29/15  1547 History   First MD Initiated Contact with Patient 10/29/15 1609     Chief Complaint  Patient presents with  . Rash      HPI Comments: Patient noticed a red circular rash on her left forearm four days ago and believes that she must have had an insect bite although she recalls no injury.  The area itches and is enlarging.  No fevers, chills, and sweats.  She feels well otherwise  Patient is a 60 y.o. female presenting with rash. The history is provided by the patient and the spouse.  Rash Location:  Shoulder/arm Shoulder/arm rash location:  L forearm Quality: dryness, itchiness, redness and swelling   Quality: not blistering, not bruising, not burning, not draining, not painful, not peeling, not scaling and not weeping   Severity:  Mild Onset quality:  Sudden Duration:  4 days Timing:  Constant Progression:  Worsening Chronicity:  New Context: insect bite/sting   Context: not animal contact, not chemical exposure, not exposure to similar rash, not food, not hot tub use, not medications, not new detergent/soap, not plant contact and not sick contacts   Relieved by:  None tried Worsened by:  Nothing tried Ineffective treatments:  None tried Associated symptoms: no abdominal pain, no diarrhea, no fatigue, no fever, no headaches, no induration, no joint pain, no myalgias, no nausea, no shortness of breath, no sore throat, no throat swelling and no URI     Past Medical History  Diagnosis Date  . Anxiety   . Vitamin D deficiency   . Spondylolisthesis   . Hx of colonic polyp - sessile serrated polyp 04/06/2015   Past Surgical History  Procedure Laterality Date  . Breast lumpectomy      right breast benign  . Tubal ligation    . Gynecologic cryosurgery  1987  . Colonoscopy     Family History  Problem Relation Age of Onset  . Diabetes Maternal Grandfather   . Diabetes Father     controlled by diet  . Hypertension Father    ?  Marland Kitchen Heart disease Father     bypass surgery and pacemaker  . Breast cancer Mother 55  . Osteoporosis Mother   . Colon cancer Mother   . Breast cancer Paternal Grandmother 34    had mastectomy in 1940 or 1950 ? if secondary to cancer  . Colon cancer Paternal Grandfather 19   Social History  Substance Use Topics  . Smoking status: Never Smoker   . Smokeless tobacco: Never Used  . Alcohol Use: 1.8 oz/week    3 Standard drinks or equivalent per week     Comment: wine   OB History    Gravida Para Term Preterm AB TAB SAB Ectopic Multiple Living   0              Review of Systems  Constitutional: Negative for fever and fatigue.  HENT: Negative for sore throat.   Respiratory: Negative for shortness of breath.   Gastrointestinal: Negative for nausea, abdominal pain and diarrhea.  Musculoskeletal: Negative for myalgias and arthralgias.  Skin: Positive for rash.  Neurological: Negative for headaches.  All other systems reviewed and are negative.   Allergies  Penicillins and Sulfa antibiotics  Home Medications   Prior to Admission medications   Medication Sig Start Date End Date Taking? Authorizing Provider  buPROPion (WELLBUTRIN XL) 150 MG 24 hr tablet Take 1 tablet (150 mg total)  by mouth daily. 07/15/15  Yes Megan Salon, MD  Cholecalciferol (VITAMIN D PO) Take 4,000 Int'l Units by mouth.     Historical Provider, MD  clonazePAM (KLONOPIN) 1 MG tablet Take 1 tablet by mouth daily as needed. 06/06/15   Historical Provider, MD  doxycycline (VIBRAMYCIN) 100 MG capsule Take 1 capsule (100 mg total) by mouth 2 (two) times daily. Take with food 10/29/15   Kandra Nicolas, MD  ibuprofen (ADVIL,MOTRIN) 100 MG chewable tablet Chew 200 mg by mouth every 8 (eight) hours as needed (1-2 tabs po daily as needed).    Historical Provider, MD  Loratadine (CLARITIN PO) Take by mouth as needed.     Historical Provider, MD  Misc Natural Products (ESTROVEN ENERGY) TABS Take by mouth daily.     Historical Provider, MD  temazepam (RESTORIL) 15 MG capsule 1-2 capsules nightly as needed for sleep 07/15/15   Megan Salon, MD   Meds Ordered and Administered this Visit  Medications - No data to display  BP 168/97 mmHg  Pulse 87  Temp(Src) 99 F (37.2 C) (Oral)  Wt 147 lb (66.679 kg)  SpO2 100%  LMP 07/11/2011 No data found.   Physical Exam  Constitutional: She is oriented to person, place, and time. She appears well-developed and well-nourished. No distress.  HENT:  Head: Normocephalic.  Nose: Nose normal.  Mouth/Throat: Oropharynx is clear and moist.  Eyes: Conjunctivae are normal. Pupils are equal, round, and reactive to light.  Neck: Neck supple.  Cardiovascular: Normal heart sounds.   Pulmonary/Chest: Breath sounds normal.  Abdominal: There is no tenderness.  Musculoskeletal: She exhibits no edema.  Lymphadenopathy:    She has no cervical adenopathy.  Neurological: She is alert and oriented to person, place, and time.  Skin: Skin is warm and dry. Rash noted. Rash is macular.     Left forearm has a 6cm diameter nummular erythematous eruption with well defined edges and small central erythematous 31mm macule.  No induration or fluctuance.  Area is slightly warm but nontender.  Nursing note and vitals reviewed.   ED Course  Procedures none    Labs Reviewed  B. BURGDORFI ANTIBODIES  ROCKY MTN SPOTTED FVR ABS PNL(IGG+IGM)     MDM   1. Insect bite     RMSF and Lyme disease titers pending. Begin empiric doxycycline 100mg  bid for staph coverage. Followup with Family Doctor if not improved in about 5 days.    Kandra Nicolas, MD 10/29/15 [phone]

## 2015-10-29 NOTE — ED Notes (Signed)
Pt c/o possible insect bite with surrounding redness to LUE x 4 days. Otherwise asymptomatic. No otc meds used.

## 2015-10-29 NOTE — Discharge Instructions (Signed)
Tick Bite Information °Ticks are insects that attach themselves to the skin. There are many types of ticks. Common types include wood ticks and deer ticks. Sometimes, ticks carry diseases that can make a person very ill. The most common places for ticks to attach themselves are the scalp, neck, armpits, waist, and groin.  °HOW CAN YOU PREVENT TICK BITES? °Take these steps to help prevent tick bites when you are outdoors: °· Wear long sleeves and long pants. °· Wear white clothes so you can see ticks more easily. °· Tuck your pant legs into your socks. °· If walking on a trail, stay in the middle of the trail to avoid brushing against bushes. °· Avoid walking through areas with long grass. °· Put bug spray on all skin that is showing and along boot tops, pant legs, and sleeve cuffs. °· Check clothes, hair, and skin often and before going inside. °· Brush off any ticks that are not attached. °· Take a shower or bath as soon as possible after being outdoors. °HOW SHOULD YOU REMOVE A TICK? °Ticks should be removed as soon as possible to help prevent diseases. °1. If latex gloves are available, put them on before trying to remove a tick. °2. Use tweezers to grasp the tick as close to the skin as possible. You may also use curved forceps or a tick removal tool. Grasp the tick as close to its head as possible. Avoid grasping the tick on its body. °3. Pull gently upward until the tick lets go. Do not twist the tick or jerk it suddenly. This may break off the tick's head or mouth parts. °4. Do not squeeze or crush the tick's body. This could force disease-carrying fluids from the tick into your body. °5. After the tick is removed, wash the bite area and your hands with soap and water or alcohol. °6. Apply a small amount of antiseptic cream or ointment to the bite site. °7. Wash any tools that were used. °Do not try to remove a tick by applying a hot match, petroleum jelly, or fingernail polish to the tick. These methods do  not work. They may also increase the chances of disease being spread from the tick bite. °WHEN SHOULD YOU SEEK HELP? °Contact your health care provider if you are unable to remove a tick or if a part of the tick breaks off in the skin. °After a tick bite, you need to watch for signs and symptoms of diseases that can be spread by ticks. Contact your health care provider if you develop any of the following: °· Fever. °· Rash. °· Redness and puffiness (swelling) in the area of the tick bite. °· Tender, puffy lymph glands. °· Watery poop (diarrhea). °· Weight loss. °· Cough. °· Feeling more tired than normal (fatigue). °· Muscle, joint, or bone pain. °· Belly (abdominal) pain. °· Headache. °· Change in your level of consciousness. °· Trouble walking or moving your legs. °· Loss of feeling (numbness) in the legs. °· Loss of movement (paralysis). °· Shortness of breath. °· Confusion. °· Throwing up (vomiting) many times. °  °This information is not intended to replace advice given to you by your health care provider. Make sure you discuss any questions you have with your health care provider. °  °Document Released: 09/20/2009 Document Revised: 02/26/2013 Document Reviewed: 12/04/2012 °Elsevier Interactive Patient Education ©2016 [employer] ° °

## 2015-10-31 ENCOUNTER — Telehealth: Payer: Self-pay | Admitting: Emergency Medicine

## 2015-11-01 LAB — ROCKY MTN SPOTTED FVR ABS PNL(IGG+IGM)
RMSF IgG: NOT DETECTED
RMSF IgM: NOT DETECTED

## 2015-11-01 LAB — LYME AB/WESTERN BLOT REFLEX: B burgdorferi Ab IgG+IgM: <0.9 Index (ref <0.90)

## 2015-11-03 ENCOUNTER — Telehealth: Payer: Self-pay | Admitting: *Deleted

## 2015-11-23 ENCOUNTER — Other Ambulatory Visit: Payer: Self-pay

## 2015-11-23 DIAGNOSIS — Z1231 Encounter for screening mammogram for malignant neoplasm of breast: Secondary | ICD-10-CM

## 2015-12-08 ENCOUNTER — Ambulatory Visit
Admission: RE | Admit: 2015-12-08 | Discharge: 2015-12-08 | Disposition: A | Discharge status: Home / Self Care | Payer: BC Managed Care – PPO | Source: Ambulatory Visit

## 2015-12-08 DIAGNOSIS — Z1231 Encounter for screening mammogram for malignant neoplasm of breast: Secondary | ICD-10-CM

## 2016-10-24 ENCOUNTER — Other Ambulatory Visit: Payer: Self-pay | Admitting: Obstetrics & Gynecology

## 2016-10-24 DIAGNOSIS — Z1231 Encounter for screening mammogram for malignant neoplasm of breast: Secondary | ICD-10-CM

## 2016-10-25 NOTE — Progress Notes (Signed)
Patient ID: Grace Morales, female   DOB: [birth year], 61 y.o.   MRN: [id number]   61 y.o. G0P0 MarriedCaucasianF here for annual exam.  Doing well.  No vaginal bleeding.    PCP:  Dr. Jacelyn Grip, [facility]  Patient's last menstrual period was 07/11/2011.          Sexually active: Yes.    The current method of family planning is post menopausal status.    Exercising: Yes.    Cardio & strength training Smoker:  no  Health Maintenance:  Pap: 07-15-15 Neg; 05-05-14 Neg:Neg HR HPV History of abnormal Pap:  Yes, Hx of cryotherapy 1987 MMG: 12-08-15 Density B/Neg/BiRads1:TBC Colonoscopy: 03/2015 polyp; next due 03/2020 BMD: 11-05-12 mild osteopenia TDaP:  PCP Pneumonia vaccine(s): not indicated Zostavax:   Will see PCP 4/27-18 Hep C testing: PCP--Neg Screening Labs: will do with Dr. Jacelyn Grip next week, Hb today: PCP, Urine today: not done   reports that she has never smoked. She has never used smokeless tobacco. She reports that she drinks about 1.8 oz of alcohol per week . She reports that she does not use drugs.  Past Medical History:  Diagnosis Date  . Anxiety   . Hx of colonic polyp - sessile serrated polyp 04/06/2015  . Spondylolisthesis   . Vitamin D deficiency     Past Surgical History:  Procedure Laterality Date  . BREAST LUMPECTOMY     right breast benign  . COLONOSCOPY    . GYNECOLOGIC CRYOSURGERY  1987  . TUBAL LIGATION      Current Outpatient Prescriptions  Medication Sig Dispense Refill  . buPROPion (WELLBUTRIN XL) 150 MG 24 hr tablet Take 1 tablet (150 mg total) by mouth daily. 30 tablet 12  . Cholecalciferol (VITAMIN D PO) Take 4,000 Int'l Units by mouth.     . clonazePAM (KLONOPIN) 1 MG tablet Take 1 tablet by mouth daily as needed.  2  . Misc Natural Products (ESTROVEN ENERGY) TABS Take by mouth daily.    . temazepam (RESTORIL) 15 MG capsule 1-2 capsules nightly as needed for sleep 45 capsule 1   No current facility-administered medications for this visit.      Family History  Problem Relation Age of Onset  . Diabetes Maternal Grandfather   . Diabetes Father     controlled by diet  . Hypertension Father     ?  Marland Kitchen Heart disease Father     bypass surgery and pacemaker  . Kidney disease Father   . Breast cancer Mother 37  . Osteoporosis Mother   . Colon cancer Mother   . Other Mother     Dec from blood disorder  . Breast cancer Paternal Grandmother 74    had mastectomy in 1940 or 1950 ? if secondary to cancer  . Colon cancer Paternal Grandfather 13    ROS:  Pertinent items are noted in HPI.  Otherwise, a comprehensive ROS was negative.  Exam:   BP 120/62 (BP Location: Right Arm, Patient Position: Sitting, Cuff Size: Normal)   Pulse 70   Resp 14   Ht 5\' 2"  (1.575 m)   Wt 149 lb (67.6 kg)   LMP 07/11/2011   BMI 27.25 kg/m   Height: 5\' 2"  (157.5 cm)  Ht Readings from Last 3 Encounters:  10/31/16 5\' 2"  (1.575 m)  07/15/15 5' 2.5" (1.588 m)  03/29/15 5' 2.75" (1.594 m)    General appearance: alert, cooperative and appears stated age Head: Normocephalic, without obvious abnormality, atraumatic Neck: no  adenopathy, supple, symmetrical, trachea midline and thyroid normal to inspection and palpation Lungs: clear to auscultation bilaterally Breasts: normal appearance, no masses or tenderness Heart: regular rate and rhythm Abdomen: soft, non-tender; bowel sounds normal; no masses,  no organomegaly Extremities: extremities normal, atraumatic, no cyanosis or edema Skin: Skin color, texture, turgor normal. No rashes or lesions Lymph nodes: Cervical, supraclavicular, and axillary nodes normal. No abnormal inguinal nodes palpated Neurologic: Grossly normal   Pelvic: External genitalia:  no lesions              Urethra:  normal appearing urethra with no masses, tenderness or lesions              Bartholins and Skenes: normal                 Vagina: normal appearing vagina with normal color and discharge, no lesions               Cervix: no lesions              Pap taken: Yes.   Bimanual Exam:  Uterus:  normal size, contour, position, consistency, mobility, non-tender              Adnexa: normal adnexa and no mass, fullness, tenderness               Rectovaginal: Confirms               Anus:  normal sphincter tone, tiny skin tag noted at 6 o'clock  Chaperone was present for exam.  A:  Well Woman with normal exam PMP, no HRT Remote hx of abnormal pap smear 9/87 Family hx of colon cancer in mother  Family hx of breast cancer in mother Osteopenia Small perianal skin tag  P:   Mammogram guidelines reviewed pap smear obtained today.  Desires yearly pap smear.  Last HR HPV 10/15.   BMD order placed for pt to do with MM[facility] Restoril 15mg  nightly prn.  Pt uses only situationally.  #30/1RF. Lab work and vaccines done with Dr. Jacelyn Grip.  Pt has follow up next week and will likely update TDap and she is not sure when last one was done.  She plans to have the Shingrix vaccination done there as well. Return annually or prn

## 2016-10-31 ENCOUNTER — Encounter: Payer: Self-pay | Admitting: Obstetrics & Gynecology

## 2016-10-31 ENCOUNTER — Other Ambulatory Visit (HOSPITAL_COMMUNITY)
Admission: RE | Admit: 2016-10-31 | Discharge: 2016-10-31 | Disposition: A | Discharge status: Home / Self Care | Payer: BC Managed Care – PPO | Source: Ambulatory Visit | Attending: Obstetrics & Gynecology | Admitting: Obstetrics & Gynecology

## 2016-10-31 ENCOUNTER — Ambulatory Visit (INDEPENDENT_AMBULATORY_CARE_PROVIDER_SITE_OTHER): Payer: BC Managed Care – PPO | Admitting: Obstetrics & Gynecology

## 2016-10-31 VITALS — BP 120/62 | HR 70 | Resp 14 | Ht 62.0 in | Wt 149.0 lb

## 2016-10-31 DIAGNOSIS — M8580 Other specified disorders of bone density and structure, unspecified site: Secondary | ICD-10-CM | POA: Diagnosis not present

## 2016-10-31 DIAGNOSIS — Z01419 Encounter for gynecological examination (general) (routine) without abnormal findings: Secondary | ICD-10-CM

## 2016-10-31 DIAGNOSIS — Z124 Encounter for screening for malignant neoplasm of cervix: Secondary | ICD-10-CM

## 2016-10-31 MED ORDER — TEMAZEPAM 15 MG PO CAPS: ORAL_CAPSULE | ORAL | 1 refills | Status: DC

## 2016-10-31 NOTE — Patient Instructions (Signed)
Check about hep C testing

## 2016-11-01 LAB — CYTOLOGY - PAP: Diagnosis: NEGATIVE

## 2016-12-14 ENCOUNTER — Ambulatory Visit: Payer: BC Managed Care – PPO

## 2016-12-19 ENCOUNTER — Ambulatory Visit
Admission: RE | Admit: 2016-12-19 | Discharge: 2016-12-19 | Disposition: A | Discharge status: Home / Self Care | Payer: BC Managed Care – PPO | Source: Ambulatory Visit | Attending: Obstetrics & Gynecology | Admitting: Obstetrics & Gynecology

## 2016-12-19 DIAGNOSIS — M8580 Other specified disorders of bone density and structure, unspecified site: Secondary | ICD-10-CM

## 2016-12-19 DIAGNOSIS — Z1231 Encounter for screening mammogram for malignant neoplasm of breast: Secondary | ICD-10-CM

## 2017-09-12 ENCOUNTER — Other Ambulatory Visit: Payer: Self-pay | Admitting: Obstetrics & Gynecology

## 2017-09-12 NOTE — Telephone Encounter (Signed)
Medication refill request: restoril  Last AEX:  10/31/16 SM  Next AEX: 11/05/17  Last MMG (if hormonal medication request): 12/19/16 BIRADS 1 negative  Refill authorized: 10/31/16 #45, 1 RF. Today, please advise.

## 2017-11-05 ENCOUNTER — Encounter: Payer: Self-pay | Admitting: Obstetrics & Gynecology

## 2017-11-05 ENCOUNTER — Ambulatory Visit: Payer: BC Managed Care – PPO | Admitting: Obstetrics & Gynecology

## 2017-11-05 ENCOUNTER — Other Ambulatory Visit (HOSPITAL_COMMUNITY)
Admission: RE | Admit: 2017-11-05 | Discharge: 2017-11-05 | Disposition: A | Discharge status: Home / Self Care | Payer: BC Managed Care – PPO | Source: Ambulatory Visit | Attending: Obstetrics & Gynecology | Admitting: Obstetrics & Gynecology

## 2017-11-05 ENCOUNTER — Other Ambulatory Visit: Payer: Self-pay

## 2017-11-05 VITALS — BP 126/82 | HR 64 | Resp 14 | Ht 62.5 in | Wt 147.0 lb

## 2017-11-05 DIAGNOSIS — Z124 Encounter for screening for malignant neoplasm of cervix: Secondary | ICD-10-CM | POA: Insufficient documentation

## 2017-11-05 DIAGNOSIS — Z01419 Encounter for gynecological examination (general) (routine) without abnormal findings: Secondary | ICD-10-CM

## 2017-11-05 MED ORDER — TEMAZEPAM 15 MG PO CAPS: ORAL_CAPSULE | ORAL | 1 refills | Status: DC

## 2017-11-05 NOTE — Progress Notes (Signed)
62 y.o. G0P0 MarriedCaucasianF here for annual exam.  Doing well.  Reports Dr. Jacelyn Grip is watching her cholesterol.  HDLs were good.  Total was around 240.  This was done in March.  Denies vaginal bleeding.    Husband is 43 and just retired.  Patient's last menstrual period was 07/11/2011.          Sexually active: Yes.    The current method of family planning is post menopausal status.    Exercising: Yes.    weights, cardio  Smoker:  no  Health Maintenance: Pap:  10/31/16 Neg   07/15/15 Neg  History of abnormal Pap:  Yes, remote hx  MMG:  12/19/16 BIRADS1:Neg  Colonoscopy:  03/29/15 polyp, follow up 5 years. BMD:   12/19/16 Osteopenia, -1.2 TDaP:  Current with PCP Pneumonia vaccine(s):  n/a Shingrix:  D/w pt having vaccination. Hep C testing: Done  Screening Labs: PCP   reports that she has never smoked. She has never used smokeless tobacco. She reports that she drinks about 1.8 oz of alcohol per week. She reports that she does not use drugs.  Past Medical History:  Diagnosis Date  . Anxiety   . Hx of colonic polyp - sessile serrated polyp 04/06/2015  . Spondylolisthesis   . Vitamin D deficiency     Past Surgical History:  Procedure Laterality Date  . BREAST EXCISIONAL BIOPSY Right 2002  . BREAST LUMPECTOMY     right breast benign  . COLONOSCOPY    . GYNECOLOGIC CRYOSURGERY  1987  . TUBAL LIGATION      Current Outpatient Medications  Medication Sig Dispense Refill  . buPROPion (WELLBUTRIN XL) 150 MG 24 hr tablet Take 1 tablet (150 mg total) by mouth daily. 30 tablet 12  . Cholecalciferol 4000 units CAPS Take 1 capsule by mouth daily.    . temazepam (RESTORIL) 15 MG capsule TAKE 1-2 CAPSULES BY MOUTH NIGHTLY AS NEEDED FOR SLEEP 45 capsule 1   No current facility-administered medications for this visit.     Family History  Problem Relation Age of Onset  . Diabetes Maternal Grandfather   . Diabetes Father        controlled by diet  . Hypertension Father        ?  Marland Kitchen  Heart disease Father        bypass surgery and pacemaker  . Kidney disease Father   . Breast cancer Mother 108  . Osteoporosis Mother   . Colon cancer Mother   . Other Mother        Dec from blood disorder  . Breast cancer Paternal Grandmother 42       had mastectomy in 1940 or 1950 ? if secondary to cancer  . Colon cancer Paternal Grandfather 62    Review of Systems  All other systems reviewed and are negative.   Exam:   BP 126/82 (BP Location: Left Arm, Patient Position: Sitting, Cuff Size: Normal)   Pulse 64   Resp 14   Ht 5' 2.5" (1.588 m)   Wt 147 lb (66.7 kg)   LMP 07/11/2011   BMI 26.46 kg/m     Height: 5' 2.5" (158.8 cm)  Ht Readings from Last 3 Encounters:  11/05/17 5' 2.5" (1.588 m)  10/31/16 5\' 2"  (1.575 m)  07/15/15 5' 2.5" (1.588 m)    General appearance: alert, cooperative and appears stated age Head: Normocephalic, without obvious abnormality, atraumatic Neck: no adenopathy, supple, symmetrical, trachea midline and thyroid normal to inspection and palpation  Lungs: clear to auscultation bilaterally Breasts: normal appearance, no masses or tenderness Heart: regular rate and rhythm Abdomen: soft, non-tender; bowel sounds normal; no masses,  no organomegaly Extremities: extremities normal, atraumatic, no cyanosis or edema Skin: Skin color, texture, turgor normal. No rashes or lesions Lymph nodes: Cervical, supraclavicular, and axillary nodes normal. No abnormal inguinal nodes palpated Neurologic: Grossly normal   Pelvic: External genitalia:  no lesions              Urethra:  normal appearing urethra with no masses, tenderness or lesions              Bartholins and Skenes: normal                 Vagina: normal appearing vagina with normal color and discharge, no lesions              Cervix: no lesions              Pap taken: Yes.   Bimanual Exam:  Uterus:  normal size, contour, position, consistency, mobility, non-tender              Adnexa: normal  adnexa               Rectovaginal: Confirms               Anus:  normal sphincter tone, no lesions  Chaperone was present for exam.  A:  Well Woman with normal exam PMP, no HT H/O abnormal pap smear 9/87 Family hx of colon cancer/breast cancer in mother Mild osteopenia  P:   Mammogram guidelines reviewed pap smear and HR HPV obtained today. Lab work it UTD with Dr. Jacelyn Grip Colonoscopy and BMD are UTD Does not need RF for Restoril now.  Uses about a one a week.   Shingrix vaccination information provided return annually or prn

## 2017-11-07 LAB — CYTOLOGY - PAP
Diagnosis: NEGATIVE
HPV: NOT DETECTED

## 2017-11-23 ENCOUNTER — Other Ambulatory Visit: Payer: Self-pay | Admitting: Obstetrics & Gynecology

## 2017-11-23 DIAGNOSIS — Z1231 Encounter for screening mammogram for malignant neoplasm of breast: Secondary | ICD-10-CM

## 2017-12-20 ENCOUNTER — Ambulatory Visit
Admission: RE | Admit: 2017-12-20 | Discharge: 2017-12-20 | Disposition: A | Discharge status: Home / Self Care | Payer: BC Managed Care – PPO | Source: Ambulatory Visit | Attending: Obstetrics & Gynecology | Admitting: Obstetrics & Gynecology

## 2017-12-20 DIAGNOSIS — Z1231 Encounter for screening mammogram for malignant neoplasm of breast: Secondary | ICD-10-CM

## 2018-05-14 ENCOUNTER — Other Ambulatory Visit: Payer: Self-pay | Admitting: Obstetrics & Gynecology

## 2018-05-14 NOTE — Telephone Encounter (Signed)
Medication refill request: restoril  15mg   Last AEX:  11/05/17 with SM Next AEX: 01/24/19 Last MMG (if hormonal medication request): 12/20/17 Bi-rads 1 neg  Refill authorized: #45 with 1RF Please advise.

## 2019-01-22 ENCOUNTER — Other Ambulatory Visit: Payer: Self-pay

## 2019-01-24 ENCOUNTER — Encounter: Payer: Self-pay | Admitting: Obstetrics & Gynecology

## 2019-01-24 ENCOUNTER — Ambulatory Visit: Payer: BC Managed Care – PPO | Admitting: Obstetrics & Gynecology

## 2019-01-24 ENCOUNTER — Other Ambulatory Visit: Payer: Self-pay

## 2019-01-24 VITALS — BP 128/82 | HR 80 | Temp 97.9°F | Ht 62.5 in | Wt 150.0 lb

## 2019-01-24 DIAGNOSIS — Z01419 Encounter for gynecological examination (general) (routine) without abnormal findings: Secondary | ICD-10-CM | POA: Diagnosis not present

## 2019-01-24 MED ORDER — ESCITALOPRAM OXALATE 10 MG PO TABS: 10.0000 mg | ORAL_TABLET | Freq: Every day | ORAL | 2 refills | Status: DC

## 2019-01-24 MED ORDER — TEMAZEPAM 15 MG PO CAPS: ORAL_CAPSULE | ORAL | 2 refills | Status: DC

## 2019-01-24 NOTE — Progress Notes (Signed)
63 y.o. G0P0 Married White or Caucasian female here for annual exam.  Doing well.  Denies vaginal bleeding.    Husband had prostatectomy five weeks ago.  Dr. Alinda Money did the surgery.  He's still having incontinence issues.  Hasn't been able to see her Dad since "all this started".    Very tearful today.  PCP:  Dr. Jacelyn Grip.  Last appt was fall, 2020.  She will have blood work then.    Patient's last menstrual period was 07/11/2011.          Sexually active: Yes.    The current method of family planning is post menopausal status.    Exercising: Yes.    trainer, cardio, weights Smoker:  no  Health Maintenance: Pap:  11/05/17 Neg. HR HPV:neg   10/31/16 neg  History of abnormal Pap:  Yes, remote hx MMG:  12/20/17 BIRADS1:BIRADS1:Neg Colonoscopy:  03/29/15 polyp. F/u 5 years.  Dr. Carlean Purl. BMD:   12/19/16 Osteopenia  TDaP:  PCP Pneumonia vaccine(s):  n/a Shingrix:   Completed Hep C testing: done  Screening Labs: PCP   reports that she has never smoked. She has never used smokeless tobacco. She reports current alcohol use of about 3.0 - 4.0 standard drinks of alcohol per week. She reports that she does not use drugs.  Past Medical History:  Diagnosis Date  . Anxiety   . Hx of colonic polyp - sessile serrated polyp 04/06/2015  . Spondylolisthesis   . Vitamin D deficiency     Past Surgical History:  Procedure Laterality Date  . BREAST EXCISIONAL BIOPSY Right 2002  . COLONOSCOPY    . GYNECOLOGIC CRYOSURGERY  1987  . TUBAL LIGATION      Current Outpatient Medications  Medication Sig Dispense Refill  . Cholecalciferol 4000 units CAPS Take 1 capsule by mouth daily.    Marland Kitchen docusate sodium (COLACE) 100 MG capsule     . temazepam (RESTORIL) 15 MG capsule TAKE 1-2 CAPSULES BY MOUTH NIGHTLY AS NEEDED FOR SLEEP 45 capsule 1   No current facility-administered medications for this visit.     Family History  Problem Relation Age of Onset  . Diabetes Maternal Grandfather   . Diabetes Father        controlled by diet  . Hypertension Father        ?  Marland Kitchen Heart disease Father        bypass surgery and pacemaker  . Kidney disease Father   . Breast cancer Mother 71  . Osteoporosis Mother   . Colon cancer Mother   . Other Mother        Dec from blood disorder  . Breast cancer Paternal Grandmother 28       had mastectomy in 1940 or 1950 ? if secondary to cancer  . Colon cancer Paternal Grandfather 18    Review of Systems  All other systems reviewed and are negative.   Exam:   BP 128/82   Pulse 80   Temp 97.9 F (36.6 C) (Temporal)   Ht 5' 2.5" (1.588 m)   Wt 150 lb (68 kg)   LMP 07/11/2011   BMI 27.00 kg/m   Height: 5' 2.5" (158.8 cm)  Ht Readings from Last 3 Encounters:  01/24/19 5' 2.5" (1.588 m)  11/05/17 5' 2.5" (1.588 m)  10/31/16 5\' 2"  (1.575 m)    General appearance: alert, cooperative and appears stated age Head: Normocephalic, without obvious abnormality, atraumatic Neck: no adenopathy, supple, symmetrical, trachea midline and thyroid normal to inspection  and palpation Lungs: clear to auscultation bilaterally Breasts: normal appearance, no masses or tenderness Heart: regular rate and rhythm Abdomen: soft, non-tender; bowel sounds normal; no masses,  no organomegaly Extremities: extremities normal, atraumatic, no cyanosis or edema Skin: Skin color, texture, turgor normal. No rashes or lesions Lymph nodes: Cervical, supraclavicular, and axillary nodes normal. No abnormal inguinal nodes palpated Neurologic: Grossly normal   Pelvic: External genitalia:  no lesions              Urethra:  normal appearing urethra with no masses, tenderness or lesions              Bartholins and Skenes: normal                 Vagina: normal appearing vagina with normal color and discharge, no lesions              Cervix: no lesions              Pap taken: No. Bimanual Exam:  Uterus:  normal size, contour, position, consistency, mobility, non-tender              Adnexa:  normal adnexa and no mass, fullness, tenderness               Rectovaginal: Confirms               Anus:  normal sphincter tone, no lesions  Chaperone was present for exam.  A:  Well Woman with normal exam PMP, no HRT Remote hx of abnormal pap smear in 1987 Family hx of colon cancer and breast cancer in her mother Mild osteopenia  P:   Mammogram guidelines reviewed.  She will schedule this. pap smear with neg HR HPV 2019.  Not indicated today. Lab work will be done with Dr. Jacelyn Grip Vaccines UTD Colonoscopy due 2021 with Dr. Carlean Purl Start Lexapro 10mg  daily.  #30/1RF.  Recheck 3 weeks. RF for Restoril 1mg  1-2 capsules nightly prn.  #45/3RF. Return annually or prn

## 2019-02-15 ENCOUNTER — Other Ambulatory Visit: Payer: Self-pay | Admitting: Obstetrics & Gynecology

## 2019-02-17 ENCOUNTER — Telehealth: Payer: BC Managed Care – PPO | Admitting: Obstetrics & Gynecology

## 2019-02-17 NOTE — Telephone Encounter (Signed)
Left message to call Sharee Pimple, RN at [facility].   OV on 01/24/19, started Lexapro, f/u in 3wks.  Patient needs OV with Dr. Sabra Heck

## 2019-02-17 NOTE — Telephone Encounter (Signed)
Medication refill request: Lexapro Last AEX:  01/24/19 SM Next AEX: 04/02/20  Last MMG (if hormonal medication request): 12/20/17 BIRADS 1 negative/density b Refill authorized: Please advise on refill; Dr. Sabra Heck is out office today. Pharmacy requesting 90-day supply

## 2019-02-17 NOTE — Telephone Encounter (Signed)
Please schedule a recheck appointment with Dr. Sabra Heck for follow up of Lexapro if this has not already been done.

## 2019-02-18 NOTE — Telephone Encounter (Signed)
Patient called and scheduled MyChart Video Visit with Dr Sabra Heck for 02/24/2019 at 11:30AM.

## 2019-02-18 NOTE — Telephone Encounter (Signed)
Call to patient. Advised Dr Quincy Simmonds has refilled for 30 days until she has follow-up with Dr Sabra Heck.  Patient has now scheduled follow-up for 02-24-19. Advised once Dr Sabra Heck is sure this is correct med and dose, will send 90 day supply with refills till annual.  Patient states medication is working. She is only taking 5mg  due to possible GI upset.  Advised this is something will need to review with Dr Sabra Heck to determine if adjustments needed.  Encounter closed.

## 2019-02-24 ENCOUNTER — Telehealth (INDEPENDENT_AMBULATORY_CARE_PROVIDER_SITE_OTHER): Payer: BC Managed Care – PPO | Admitting: Obstetrics & Gynecology

## 2019-02-24 ENCOUNTER — Other Ambulatory Visit: Payer: Self-pay

## 2019-02-24 ENCOUNTER — Encounter: Payer: Self-pay | Admitting: Obstetrics & Gynecology

## 2019-02-24 DIAGNOSIS — F430 Acute stress reaction: Secondary | ICD-10-CM | POA: Diagnosis not present

## 2019-02-24 MED ORDER — ESCITALOPRAM OXALATE 10 MG PO TABS: 10.0000 mg | ORAL_TABLET | Freq: Every day | ORAL | 3 refills | Status: DC

## 2019-02-24 NOTE — Progress Notes (Signed)
Virtual Visit via Video Note  I connected with Grace Morales on 02/24/19 at 11:30 AM EDT by a video enabled telemedicine application and verified that I am speaking with the correct person using two identifiers.  Location: Patient: home Provider: office   I discussed the limitations of evaluation and management by telemedicine and the availability of in person appointments. The patient expressed understanding and agreed to proceed.  History of Present Illness: 63 yo G0 MWF for follow-up discussion after starting Lexapro on 01/24/2019.  She started on Lexapro 5mg  for the first three weeks and then increased to 10mg  daily.  She's been taking this for about 7 days.  Reports she's feeling much better.  She is not nearly as tearful as she was a month.  Reports husband is healing from his prostate surgery.  She's been able to see her dad three times since the last time I saw her.  Feels her energy level is better and she is interested in continuing this.  We discussed dosing and length of therapy.  For now, will continue to    Observations/Objective:  Physical Exam  Constitutional: She is oriented to person, place, and time. She appears well-developed and well-nourished.  Neurological: She is alert and oriented to person, place, and time.  Psychiatric: She has a normal mood and affect. Her behavior is normal. Judgment normal.   Assessment and Plan: Situational depressed mood, improved with Lexapro. Rx for Lexapro 10mg  daily to pharmacy.  #90/3RF.  She is going to take this for six months and then consider decreasing dosage or stopping, depending on family and world events/stressors.  Knows to call if decides to lower or stop medication.  Follow Up Instructions: She will call if had any new concerns or issues.  Will plan routine gyn exam 1 year.  This is already scheduled.  Pt and I discussed the assessment and treatment plan.  Questions answered.  She understands plan and reasons for  calling back.    The patient was advised to call back or seek an in-person evaluation if the symptoms worsen or if the condition fails to improve as anticipated.  I provided 17 minutes of non-face-to-face time during this encounter.   Megan Salon, MD

## 2019-03-19 ENCOUNTER — Other Ambulatory Visit: Payer: Self-pay | Admitting: Obstetrics & Gynecology

## 2019-03-19 ENCOUNTER — Other Ambulatory Visit: Payer: Self-pay | Admitting: Nurse Practitioner

## 2019-03-19 DIAGNOSIS — Z1231 Encounter for screening mammogram for malignant neoplasm of breast: Secondary | ICD-10-CM

## 2019-05-05 ENCOUNTER — Other Ambulatory Visit: Payer: Self-pay

## 2019-05-05 ENCOUNTER — Ambulatory Visit
Admission: RE | Admit: 2019-05-05 | Discharge: 2019-05-05 | Disposition: A | Discharge status: Home / Self Care | Payer: BC Managed Care – PPO | Source: Ambulatory Visit | Attending: Obstetrics & Gynecology | Admitting: Obstetrics & Gynecology

## 2019-05-05 DIAGNOSIS — Z1231 Encounter for screening mammogram for malignant neoplasm of breast: Secondary | ICD-10-CM

## 2019-05-07 ENCOUNTER — Encounter: Payer: BC Managed Care – PPO | Attending: Family Medicine | Admitting: Registered"

## 2019-05-07 ENCOUNTER — Encounter: Payer: Self-pay | Admitting: Registered"

## 2019-05-07 ENCOUNTER — Other Ambulatory Visit: Payer: Self-pay

## 2019-05-07 DIAGNOSIS — E785 Hyperlipidemia, unspecified: Secondary | ICD-10-CM | POA: Insufficient documentation

## 2019-05-07 NOTE — Progress Notes (Signed)
Medical Nutrition Therapy:  Appt start time: 1030 end time:  1130.   Assessment:  Primary concerns today:  Pt states she wants to change diet to address hyperlipidemia and wants to eat better in general to feel better. Pt states she would like to feel less bloated. Pt states she doesn't want to do special diet. Pt states she tries to do portion control.   Pt states she loves vegetables, cheese, olives. Pt states her husband is not a fan of vegetables so she doesn't want to go through the hassle of preparing veggies just for herself.  Pt reports she loves desserts, except chocolate not appealing, will also eat chips if available. Pt states she hates cooking, wants foods that are convenient. Due to convenience she will often have cereal for breakfast. Pt states between 3-4 pm she gets hungry and wants ideas for snacks.  Pt states she has always had an issue with constipation and switching from colace to metamucil has helped.  Pt reports being active, works with a Physiological scientist who encourages her to have a good diet because one "can't exercise away a bad diet" Pt has tried fitness pal, counting macros and counting calories, and although helped her lose weight and she felt better, has not been sustainable.  Sleep: 6-7 hrs; Pt states not good since menopause. Wakes up during night with mind racing. Per fit bit does not get much deep sleep. Pt states she uses temazepam 1-2x week which helps her sleep, but doesn't want to take too often.  Referral Lab: LDL 153 HDL 73 TG 78  Preferred Learning Style:   No preference indicated   Learning Readiness:   Ready   MEDICATIONS: reviewed   DIETARY INTAKE:  24-hr recall:  B ( AM): great grains cereal or oatmeal, 2% milk  Snk ( AM): none  L (11 PM): left overs OR sandwich with [place] meat OR roasted veggies, black beans Snk ( PM): wine & peanuts, granola OR goldfish OR cheese D ( PM): grilled hamburger, bun, mayo, tator tots OR tacos OR  stir fry OR spaghetti Snk ( PM): peanuts Beverages: water, 1-2x week wine/1-2 glasses  Usual physical activity: very active (didn't assess quantity)  Estimated energy needs: 1600-1800 calories  Progress Towards Goal(s):  New goals.   Nutritional Diagnosis:  NI-5.8.5 Inadeqate fiber intake As related to limited intake of vegetable and whole grains.  As evidenced by dietary recall.    Intervention:  Nutrition Education. Discussed foods that affect LDL cholesterol. Discussed role of stress on health. Discussed sleep hygiene.  Teaching Method Utilized:  Visual Auditory  Handouts given during visit include:  Sleep Hygiene  Healthy Eating: How to Eat a Heart-Healthy Diet  11 Foods that lower cholesterol  MyPlate Planner  Balanced Snack Sheet  Barriers to learning/adherence to lifestyle change: none  Demonstrated degree of understanding via:  Teach Back   Monitoring/Evaluation:  Dietary intake, exercise, sleep, and body weight in 5 week(s).

## 2019-05-07 NOTE — Patient Instructions (Addendum)
Plan: Consider changing your bedtime routine and go to sleep when sleepy. See handout for ideas. Aim to eat balanced meals and snacks Pay attention to hunger/fullness cues to decide how much to eat. Consider saving food for later instead of cleaning plate.  To help lower cholesterol: Reduce butter, cheese and other saturated fat intake. Consider switching to Benecol, add more pistachios to diet, continue eating oatmeal and beans.  Before next appointment keep a 3-day food diary including time and portions of foods eaten as well as bloating symptoms.

## 2019-06-11 ENCOUNTER — Ambulatory Visit: Payer: BC Managed Care – PPO | Admitting: Registered"

## 2019-09-15 ENCOUNTER — Telehealth: Payer: Self-pay | Admitting: *Deleted

## 2019-09-15 NOTE — Telephone Encounter (Signed)
Spoke with patient. Patient started Lexapro 10 mg daily in 01/2019. Patient reports "situation" has improved and she would like to wean off of medication. Patient states she was advised to call when she is ready to do this. Advised patient I will review with Dr. Sabra Heck and f/u. Patient is agreeable.   Routing to Dr. Sabra Heck to advise.

## 2019-09-15 NOTE — Telephone Encounter (Signed)
Patient is returning call to Jill, RN.  °

## 2019-09-15 NOTE — Telephone Encounter (Signed)
Spoke with spouse, Ernestine Mcmurray, Left message to call Sharee Pimple, RN at [facility].

## 2019-09-15 NOTE — Telephone Encounter (Signed)
Patient want to see Dr Sabra Heck to discuss getting of Lexapro.

## 2019-09-17 NOTE — Telephone Encounter (Signed)
She is on a low dose so should be able to wean off fairly quickly.  She should decrease to 5mg  for at least two weeks and then take 5mg  every other day for at least 2 weeks and then stop.

## 2019-09-18 NOTE — Telephone Encounter (Signed)
Spoke with patient, advised as seen below per Dr. Miller. Patient verbalizes understanding and is agreeable.   Encounter closed.  

## 2019-11-14 ENCOUNTER — Other Ambulatory Visit: Payer: Self-pay | Admitting: Otolaryngology

## 2019-11-14 DIAGNOSIS — H918X9 Other specified hearing loss, unspecified ear: Secondary | ICD-10-CM

## 2019-11-15 ENCOUNTER — Other Ambulatory Visit: Payer: Self-pay | Admitting: Obstetrics & Gynecology

## 2019-11-17 NOTE — Telephone Encounter (Signed)
Medication refill request: temazepam  Last AEX:  01-24-2019 SM  Next AEX: 04-02-20 Last MMG (if hormonal medication request): n/a Refill authorized: Today, please advise.   Medication pended for #45, 1RF. Please refill if appropriate.

## 2019-12-12 ENCOUNTER — Ambulatory Visit
Admission: RE | Admit: 2019-12-12 | Discharge: 2019-12-12 | Disposition: A | Discharge status: Home / Self Care | Payer: BC Managed Care – PPO | Source: Ambulatory Visit | Attending: Otolaryngology | Admitting: Otolaryngology

## 2019-12-12 DIAGNOSIS — H918X9 Other specified hearing loss, unspecified ear: Secondary | ICD-10-CM

## 2019-12-12 MED ORDER — GADOBENATE DIMEGLUMINE 529 MG/ML IV SOLN
14.0000 mL | Freq: Once | INTRAVENOUS | Status: AC | PRN
  Administered 2019-12-12: 14 mL via INTRAVENOUS

## 2019-12-29 ENCOUNTER — Ambulatory Visit (INDEPENDENT_AMBULATORY_CARE_PROVIDER_SITE_OTHER): Payer: BC Managed Care – PPO | Admitting: Rehabilitative and Restorative Service Providers"

## 2019-12-29 ENCOUNTER — Other Ambulatory Visit: Payer: Self-pay

## 2019-12-29 DIAGNOSIS — R42 Dizziness and giddiness: Secondary | ICD-10-CM

## 2019-12-29 DIAGNOSIS — R2689 Other abnormalities of gait and mobility: Secondary | ICD-10-CM | POA: Diagnosis not present

## 2019-12-29 NOTE — Therapy (Signed)
[place] [facility], [place], [address] Phone: [phone]   Fax:  [phone]  Physical Therapy Evaluation  Patient Details  Name: Grace Morales MRN: [id number] Date of Birth: 11/10/1955 Referring Provider (PT): Leta Baptist, MD   Encounter Date: 12/29/2019   PT End of Session - 12/29/19 1302    Visit Number 1    Number of Visits 6    Date for PT Re-Evaluation 02/09/20    PT Start Time 0850    PT Stop Time 0935    PT Time Calculation (min) 45 min           Past Medical History:  Diagnosis Date  . Anxiety   . Hx of colonic polyp - sessile serrated polyp 04/06/2015  . Spondylolisthesis   . Vitamin D deficiency     Past Surgical History:  Procedure Laterality Date  . BREAST EXCISIONAL BIOPSY Right 2002  . COLONOSCOPY    . GYNECOLOGIC CRYOSURGERY  1987  . TUBAL LIGATION      There were no vitals filed for this visit.    Subjective Assessment - 12/29/19 0852    Subjective The patient reports she has had dizziness off and on for years.  In November 2020, it got worse.  She was working with a Physiological scientist and felt like symptoms worsened after positional changes that lasted x seconds.  She is currently experiencing intermittent dizziness that comes and goes each day.  She has had 2 episodes of severe vertigo that lasted x 2 days (stayed in bed--the last episode 3 years ago).    Pertinent History patient notes she has spondylolisthesis (has lateral thigh numbness);    Patient Stated Goals learn how to compensate for dizziness    Currently in Pain? No/denies              [facility] PT Assessment - 12/29/19 5573      Assessment   Medical Diagnosis dizziness, unilateral L vestibular hypofunction    Referring Provider (PT) Leta Baptist, MD    Onset Date/Surgical Date --   November 2020   Hand Dominance Left    Prior Therapy none      Precautions   Precautions None      Restrictions   Weight  Bearing Restrictions No      Balance Screen   Has the patient fallen in the past 6 months No    Has the patient had a decrease in activity level because of a fear of falling?  No    Is the patient reluctant to leave their home because of a fear of falling?  No      Home Environment   Living Environment Private residence    Type of Home House    Additional Comments notes some unsteadiness with walking in the house at night      Prior Function   Level of Independence Independent    Vocation Retired    Leisure works out 2 days/week with Surveyor, mining   High Level Balance Comments foam standing with eyes open and eyes closed x 30 seconds (increased sway, but able to compensate), single leg stance x 10 seconds bilaterally                  Vestibular Assessment - 12/29/19 0906      Vestibular Assessment   General Observation The patient ambulated into clinic without a device independently  Symptom Behavior   Subjective history of current problem h/o intermittent dizziness    Type of Dizziness  Imbalance;Spinning    Frequency of Dizziness daily    Duration of Dizziness hours    Symptom Nature Motion provoked;Spontaneous    Aggravating Factors Activity in general;Mornings    Relieving Factors No known relieving factors    Progression of Symptoms Worse      Oculomotor Exam   Oculomotor Alignment Normal    Ocular ROM WFLs    Spontaneous Absent    Gaze-induced  Absent    Smooth Pursuits Intact    Saccades Intact      Vestibulo-Ocular Reflex   VOR 1 Head Only (x 1 viewing) Sensation of dizziness when stopping movement 10 reps of self regulated pace VOR rating 3/10.    VOR Cancellation Normal   however provokes a 6/10 sensation of dizziness with nausea   Comment head impulse test=negative; patient able to maintain gaze fixation with spontaneous passive head impulses      Visual Acuity   Static line 7    Dynamic line 1   with 4/10 dizziness      Positional Testing   Dix-Hallpike Dix-Hallpike Right;Dix-Hallpike Left    Horizontal Canal Testing Horizontal Canal Right;Horizontal Canal Left      Dix-Hallpike Right   Dix-Hallpike Right Duration none    Dix-Hallpike Right Symptoms No nystagmus      Dix-Hallpike Left   Dix-Hallpike Left Duration none    Dix-Hallpike Left Symptoms No nystagmus      Horizontal Canal Right   Horizontal Canal Right Duration none    Horizontal Canal Right Symptoms Normal      Horizontal Canal Left   Horizontal Canal Left Duration none    Horizontal Canal Left Symptoms Normal              Objective measurements completed on examination: See above findings.        Vestibular Treatment/Exercise - 12/29/19 0919      Vestibular Treatment/Exercise   Vestibular Treatment Provided Gaze;Habituation    Habituation Exercises Standing Horizontal Head Turns    Gaze Exercises X1 Viewing Horizontal;X1 Viewing Vertical      Standing Horizontal Head Turns   Number of Reps  10    Symptom Description  with feet together in standing      X1 Viewing Horizontal   Foot Position standing    Comments 20 seconds with cues on technique; patient gets increased dizziness with horizontal head motion      X1 Viewing Vertical   Foot Position standing    Comments 20 seconds with cues on technique                 PT Education - 12/29/19 1302    Education Details initiated HEP for gaze adaptation horiz/vertical; standing head motion for habituation    Person(s) Educated Patient    Methods Explanation;Demonstration;Handout    Comprehension Returned demonstration;Verbalized understanding               PT Long Term Goals - 12/29/19 1303      PT LONG TERM GOAL #1   Title The patient will be indep with HEP for motion sensitivity, gaze adaptation, haibituation and multi-sensory balance challenges.    Time 6    Period Weeks    Target Date 02/09/20      PT LONG TERM GOAL #2   Title The  patient will tolerate gaze x 1 adaptation horiz plane x 60 seconds  with change in symptoms < or equal to 2/10.    Time 6    Period Weeks    Target Date 02/09/20      PT LONG TERM GOAL #3   Title The patient will tolerate standing horiz head turns x 10 reps with dizziness < or equal to 2/10.    Time 6    Period Weeks    Target Date 02/09/20      PT LONG TERM GOAL #4   Title The patient will improve SVA versus DVA to < or equal to 4 line difference to demo vestibular compensation.    Baseline 6 line difference    Time 6    Period Weeks    Target Date 02/09/20      PT LONG TERM GOAL #5   Title The patient will subjectively report dizziness improved by 40% to demo dec'd functional limitation from symtpoms.    Time 6    Period Weeks    Target Date 02/09/20                  Plan - 12/29/19 1306    Clinical Impression Statement The patient is a 64 yo female presenting to OP physical therapy with h/o intermittent vertigo worsening since 05/2019.  She presents with referral for L vestibular hypofunction (measured at 47% with VNG).  Impairments include decreased SVA versus DVA (per 6 line difference), dec'd tolerance to VOR in horizontal plane, dec'd multi-sensory balance noted by sway with EC, general motion sensitivity and dec'd tolerance to activities.  She presents with functional limitations including dec'd participation in IADLs and avoidance of quick movements.  PT to address to optimize functional status and teach self mgmt of symptoms.    Stability/Clinical Decision Making Stable/Uncomplicated    Clinical Decision Making Low    Rehab Potential Good    PT Frequency 1x / week    PT Duration 6 weeks    PT Treatment/Interventions ADLs/Self Care Home Management;Gait training;Patient/family education;Canalith Repostioning;Vestibular;Therapeutic exercise;Balance training;Neuromuscular re-education;Therapeutic activities;Manual techniques    PT Next Visit Plan progress gaze x 1  viewing in time, check DGI and add further components to develop motion sensitivity program; multi-sensory balance activities    PT Home Exercise Plan Access Code: [email or web address]    Consulted and Agree with Plan of Care Patient           Patient will benefit from skilled therapeutic intervention in order to improve the following deficits and impairments:  Dizziness, Decreased activity tolerance, Decreased balance  Visit Diagnosis: Dizziness and giddiness  Other abnormalities of gait and mobility     Problem List Patient Active Problem List   Diagnosis Date Noted  . Hx of colonic polyp - sessile serrated polyp 04/06/2015  . Family history of colon cancer elderly mother and elderly paternal grandfather 03/29/2015  . Situational anxiety 05/05/2014  . Osteopenia 11/07/2012    Aubreyanna Dorrough, PT 12/29/2019, 1:09 PM  [facility] [place], [place], [address] Phone: [phone]   Fax:  [phone]  Name: Grace Morales MRN: [id number] Date of Birth: 02/23/1956

## 2019-12-29 NOTE — Patient Instructions (Signed)
Access Code: [email or web address] URL: [email or web address] Date: 12/29/2019 Prepared by: Rudell Cobb  Exercises Standing Gaze Stabilization with Head Rotation - 2-3 x daily - 7 x weekly - 2 sets - 30 reps Standing Gaze Stabilization with Head Nod - 2-3 x daily - 7 x weekly - 2 sets - 30 reps Standing with Head Rotation - 2 x daily - 7 x weekly - 1 sets - 10 reps

## 2020-01-05 ENCOUNTER — Other Ambulatory Visit: Payer: Self-pay

## 2020-01-05 ENCOUNTER — Ambulatory Visit: Payer: BC Managed Care – PPO | Admitting: Rehabilitative and Restorative Service Providers"

## 2020-01-05 DIAGNOSIS — R42 Dizziness and giddiness: Secondary | ICD-10-CM | POA: Diagnosis not present

## 2020-01-05 DIAGNOSIS — R2689 Other abnormalities of gait and mobility: Secondary | ICD-10-CM

## 2020-01-05 NOTE — Patient Instructions (Signed)
Access Code: [email or web address] URL: [email or web address] Date: 01/05/2020 Prepared by: Rudell Cobb  Exercises Standing Gaze Stabilization with Head Rotation - 2-3 x daily - 7 x weekly - 2 sets - 15-20 reps Standing Gaze Stabilization with Head Nod - 2-3 x daily - 7 x weekly - 2 sets - 30 reps Standing with Head Rotation - 2 x daily - 7 x weekly - 1 sets - 10 reps Romberg Stance Eyes Closed on Foam Pad - 2 x daily - 7 x weekly - 1 sets - 10 reps Turning in Corner 360 - 2 x daily - 7 x weekly - 1 sets - 3 reps

## 2020-01-05 NOTE — Therapy (Signed)
[place], [place], [address] Phone: [phone]   Fax:  [phone]  Physical Therapy Treatment  Patient Details  Name: Grace Morales MRN: [id number] Date of Birth: 1955-10-16 Referring Provider (PT): Leta Baptist, MD   Encounter Date: 01/05/2020   PT End of Session - 01/05/20 0851    Visit Number 2    Number of Visits 6    Date for PT Re-Evaluation 02/09/20    PT Start Time 0848    PT Stop Time 0922    PT Time Calculation (min) 34 min    Activity Tolerance Patient tolerated treatment well    Behavior During Therapy [facility] for tasks assessed/performed           Past Medical History:  Diagnosis Date  . Anxiety   . Hx of colonic polyp - sessile serrated polyp 04/06/2015  . Spondylolisthesis   . Vitamin D deficiency     Past Surgical History:  Procedure Laterality Date  . BREAST EXCISIONAL BIOPSY Right 2002  . COLONOSCOPY    . GYNECOLOGIC CRYOSURGERY  1987  . TUBAL LIGATION      There were no vitals filed for this visit.   Subjective Assessment - 01/05/20 0848    Subjective The patient reports that the exercises bring on symptoms that can stay with her for hours.  She also notes increased frequency (mild daily) headaches.    Pertinent History patient notes she has spondylolisthesis (has lateral thigh numbness);    Patient Stated Goals learn how to compensate for dizziness    Currently in Pain? No/denies              [facility] PT Assessment - 01/05/20 0851      Functional Gait  Assessment   Gait assessed  Yes    Gait Level Surface Walks 20 ft in less than 5.5 sec, no assistive devices, good speed, no evidence for imbalance, normal gait pattern, deviates no more than 6 in outside of the 12 in walkway width.    Change in Gait Speed Able to smoothly change walking speed without loss of balance or gait deviation. Deviate no more than 6 in outside of the 12 in walkway width.    Gait with  Horizontal Head Turns Performs head turns smoothly with no change in gait. Deviates no more than 6 in outside 12 in walkway width    Gait with Vertical Head Turns Performs head turns with no change in gait. Deviates no more than 6 in outside 12 in walkway width.    Gait and Pivot Turn Pivot turns safely within 3 sec and stops quickly with no loss of balance.    Step Over Obstacle Is able to step over 2 stacked shoe boxes taped together (9 in total height) without changing gait speed. No evidence of imbalance.    Gait with Narrow Base of Support Is able to ambulate for 10 steps heel to toe with no staggering.    Gait with Eyes Closed Walks 20 ft, no assistive devices, good speed, no evidence of imbalance, normal gait pattern, deviates no more than 6 in outside 12 in walkway width. Ambulates 20 ft in less than 7 sec.    Ambulating Backwards Walks 20 ft, no assistive devices, good speed, no evidence for imbalance, normal gait    Steps Alternating feet, no rail.    Total Score 30  [facility] Adult PT Treatment/Exercise - 01/05/20 1004      Neuro Re-ed    Neuro Re-ed Details  compliant surface standing on foam with eyes closed x 30 seconds           Vestibular Treatment/Exercise - 01/05/20 0902      Vestibular Treatment/Exercise   Vestibular Treatment Provided Gaze;Habituation    Habituation Exercises 180 degree Turns;360 degree Turns    Gaze Exercises X1 Viewing Horizontal;X1 Viewing Vertical      Standing Horizontal Head Turns   Number of Reps  10    Symptom Description  encouraged increased speed as tolerated during HEP      180 degree Turns   Number of Reps  3    Symptom Description  with rest breaks in between repetition/ used for habituation for motion sensitivity      360 degree Turns   Number of Reps  1    Symptom Description  notes increased symptoms to 7-8/10      X1 Viewing Horizontal   Foot Position standing    Comments reduced to 15  reps to avoid HA and increasing to 7/10 symptoms      X1 Viewing Vertical   Foot Position standing    Comments tolerates 30 reps with minimal symptoms                 PT Education - 01/05/20 1019    Education Details HEP progression    Person(s) Educated Patient    Methods Explanation;Demonstration;Handout    Comprehension Returned demonstration;Verbalized understanding               PT Long Term Goals - 12/29/19 1303      PT LONG TERM GOAL #1   Title The patient will be indep with HEP for motion sensitivity, gaze adaptation, haibituation and multi-sensory balance challenges.    Time 6    Period Weeks    Target Date 02/09/20      PT LONG TERM GOAL #2   Title The patient will tolerate gaze x 1 adaptation horiz plane x 60 seconds with change in symptoms < or equal to 2/10.    Time 6    Period Weeks    Target Date 02/09/20      PT LONG TERM GOAL #3   Title The patient will tolerate standing horiz head turns x 10 reps with dizziness < or equal to 2/10.    Time 6    Period Weeks    Target Date 02/09/20      PT LONG TERM GOAL #4   Title The patient will improve SVA versus DVA to < or equal to 4 line difference to demo vestibular compensation.    Baseline 6 line difference    Time 6    Period Weeks    Target Date 02/09/20      PT LONG TERM GOAL #5   Title The patient will subjectively report dizziness improved by 40% to demo dec'd functional limitation from symtpoms.    Time 6    Period Weeks    Target Date 02/09/20                 Plan - 01/05/20 1020    Clinical Impression Statement The patient's HEP was modified to reduce gaze x 1 horizontal plane due to increased symptoms that last for hours.  Added further multi-sensory balance activities.  The patient tolerated therapy well today.    Stability/Clinical Decision Making Stable/Uncomplicated  Rehab Potential Good    PT Frequency 1x / week    PT Duration 6 weeks    PT Treatment/Interventions  ADLs/Self Care Home Management;Gait training;Patient/family education;Canalith Repostioning;Vestibular;Therapeutic exercise;Balance training;Neuromuscular re-education;Therapeutic activities;Manual techniques    PT Next Visit Plan progress gaze x 1 viewing in time, further components to develop motion sensitivity program; multi-sensory balance activities    PT Home Exercise Plan Access Code: [email or web address]    Consulted and Agree with Plan of Care Patient           Patient will benefit from skilled therapeutic intervention in order to improve the following deficits and impairments:  Dizziness, Decreased activity tolerance, Decreased balance  Visit Diagnosis: Dizziness and giddiness  Other abnormalities of gait and mobility     Problem List Patient Active Problem List   Diagnosis Date Noted  . Hx of colonic polyp - sessile serrated polyp 04/06/2015  . Family history of colon cancer elderly mother and elderly paternal grandfather 03/29/2015  . Situational anxiety 05/05/2014  . Osteopenia 11/07/2012    Sharman Garrott, PT 01/05/2020, 1:00 PM  [facility] [place], [place], [address] Phone: [phone]   Fax:  [phone]  Name: Grace Morales MRN: [id number] Date of Birth: [birth year]

## 2020-01-14 ENCOUNTER — Other Ambulatory Visit: Payer: Self-pay

## 2020-01-14 ENCOUNTER — Ambulatory Visit: Payer: BC Managed Care – PPO | Admitting: Rehabilitative and Restorative Service Providers"

## 2020-01-14 DIAGNOSIS — R42 Dizziness and giddiness: Secondary | ICD-10-CM | POA: Diagnosis not present

## 2020-01-14 DIAGNOSIS — R2689 Other abnormalities of gait and mobility: Secondary | ICD-10-CM

## 2020-01-14 NOTE — Patient Instructions (Signed)
Access Code: [email or web address] URL: [email or web address] Date: 01/14/2020 Prepared by: Rudell Cobb  Exercises Standing Gaze Stabilization with Head Rotation - 2-3 x daily - 7 x weekly - 2 sets - 20-30 reps Standing Gaze Stabilization with Head Nod - 2-3 x daily - 7 x weekly - 2 sets - 30 reps Standing with Head Rotation - 2 x daily - 7 x weekly - 1 sets - 10 reps Turning in Corner 360 - 2 x daily - 7 x weekly - 1 sets - 3 reps Ball Toss with Eye Tracking - 2 x daily - 7 x weekly - 1 sets - 10 reps

## 2020-01-14 NOTE — Therapy (Signed)
[place], [place], [address] Phone: [phone]   Fax:  [phone]  Physical Therapy Treatment  Patient Details  Name: Grace Morales MRN: [id number] Date of Birth: 1956-06-01 Referring Provider (PT): Leta Baptist, MD   Encounter Date: 01/14/2020   PT End of Session - 01/14/20 0917    Visit Number 3    Number of Visits 6    Date for PT Re-Evaluation 02/09/20    PT Start Time 0845    PT Stop Time 0923    PT Time Calculation (min) 38 min    Activity Tolerance Patient tolerated treatment well    Behavior During Therapy [facility] for tasks assessed/performed           Past Medical History:  Diagnosis Date  . Anxiety   . Hx of colonic polyp - sessile serrated polyp 04/06/2015  . Spondylolisthesis   . Vitamin D deficiency     Past Surgical History:  Procedure Laterality Date  . BREAST EXCISIONAL BIOPSY Right 2002  . COLONOSCOPY    . GYNECOLOGIC CRYOSURGERY  1987  . TUBAL LIGATION      There were no vitals filed for this visit.   Subjective Assessment - 01/14/20 0849    Subjective The patient reports she woke up 2 days without any symptoms (June 20 and July 5).  The other days she woke swimmy headed.  Performing the home exercises is getting easier, however overall she does not notice a significant change.    Pertinent History patient notes she has spondylolisthesis (has lateral thigh numbness);    Patient Stated Goals learn how to compensate for dizziness    Currently in Pain? No/denies                             [facility] Adult PT Treatment/Exercise - 01/14/20 0926      Self-Care   Self-Care Other Self-Care Comments    Other Self-Care Comments  discussed continuation of HEP, taking greater time in between sessions to allow for compensation and adaptation with ther ex      Neuro Re-ed    Neuro Re-ed Details  compliant surface standing with feet apart and feet togehter x 30  seconds            Vestibular Treatment/Exercise - 01/14/20 0856      Vestibular Treatment/Exercise   Vestibular Treatment Provided Gaze;Habituation    Habituation Exercises Standing Horizontal Head Turns;360 degree Turns;180 degree Turns;Standing Vertical Head Turns    Gaze Exercises X1 Viewing Horizontal;X1 Viewing Vertical;Eye/Head Exercise Horizontal;Eye/Head Exercise Vertical      Standing Horizontal Head Turns   Number of Reps  10    Symptom Description  on ground working on speed with HEP and then on foam working on balance x 10 head turns      Standing Vertical Head Turns   Number of Reps  10    Symptom Description  on foam      360 degree Turns   Number of Reps  3    Symptom Description  notes increase to 6/10      X1 Viewing Horizontal   Foot Position standing    Comments 15 repetitions and then 20 reps      X1 Viewing Vertical   Foot Position standing    Comments tolerates 30 seconds without symptoms      Eye/Head Exercise Horizontal   Foot Position standing  Comments ball toss in horizontal plane turning R and L      Eye/Head Exercise Vertical   Foot Position standing    Comments eye/hand coordination with tennis ball and visual tracking                 PT Education - 01/14/20 0917    Education Details HEP progression    Person(s) Educated Patient    Methods Explanation;Demonstration;Handout    Comprehension Verbalized understanding;Returned demonstration               PT Long Term Goals - 12/29/19 1303      PT LONG TERM GOAL #1   Title The patient will be indep with HEP for motion sensitivity, gaze adaptation, haibituation and multi-sensory balance challenges.    Time 6    Period Weeks    Target Date 02/09/20      PT LONG TERM GOAL #2   Title The patient will tolerate gaze x 1 adaptation horiz plane x 60 seconds with change in symptoms < or equal to 2/10.    Time 6    Period Weeks    Target Date 02/09/20      PT LONG TERM GOAL  #3   Title The patient will tolerate standing horiz head turns x 10 reps with dizziness < or equal to 2/10.    Time 6    Period Weeks    Target Date 02/09/20      PT LONG TERM GOAL #4   Title The patient will improve SVA versus DVA to < or equal to 4 line difference to demo vestibular compensation.    Baseline 6 line difference    Time 6    Period Weeks    Target Date 02/09/20      PT LONG TERM GOAL #5   Title The patient will subjectively report dizziness improved by 40% to demo dec'd functional limitation from symtpoms.    Time 6    Period Weeks    Target Date 02/09/20                 Plan - 01/14/20 4854    Clinical Impression Statement The patient's HEP was progressed to increase gaze # of repetitons, increase speed on horizontal head turns, and perform head/eye coordination activities.  PT to continue working to [place] progressing vestibular rehab to tolerance.    Stability/Clinical Decision Making Stable/Uncomplicated    Rehab Potential Good    PT Frequency 1x / week    PT Duration 6 weeks    PT Treatment/Interventions ADLs/Self Care Home Management;Gait training;Patient/family education;Canalith Repostioning;Vestibular;Therapeutic exercise;Balance training;Neuromuscular re-education;Therapeutic activities;Manual techniques    PT Next Visit Plan progress gaze x 1 viewing in time, further components to develop motion sensitivity program; multi-sensory balance activities    PT Home Exercise Plan Access Code: [email or web address]    Consulted and Agree with Plan of Care Patient           Patient will benefit from skilled therapeutic intervention in order to improve the following deficits and impairments:  Dizziness, Decreased activity tolerance, Decreased balance  Visit Diagnosis: Dizziness and giddiness  Other abnormalities of gait and mobility     Problem List Patient Active Problem List   Diagnosis Date Noted  . Hx of colonic polyp - sessile serrated polyp 04/06/2015   . Family history of colon cancer elderly mother and elderly paternal grandfather 03/29/2015  . Situational anxiety 05/05/2014  . Osteopenia 11/07/2012    Valgene Deloatch, PT 01/14/2020, 9:32  [place], [place], [address] Phone: [phone]   Fax:  [phone]  Name: Grace Morales MRN: [id number] Date of Birth: [birth year]

## 2020-01-26 ENCOUNTER — Encounter: Payer: Self-pay | Admitting: Rehabilitative and Restorative Service Providers"

## 2020-01-26 ENCOUNTER — Ambulatory Visit: Payer: BC Managed Care – PPO | Admitting: Rehabilitative and Restorative Service Providers"

## 2020-01-26 ENCOUNTER — Other Ambulatory Visit: Payer: Self-pay

## 2020-01-26 DIAGNOSIS — R42 Dizziness and giddiness: Secondary | ICD-10-CM | POA: Diagnosis not present

## 2020-01-26 DIAGNOSIS — R2689 Other abnormalities of gait and mobility: Secondary | ICD-10-CM | POA: Diagnosis not present

## 2020-01-26 NOTE — Therapy (Addendum)
[facility] [place], [place], [address] Phone: [phone]   Fax:  [phone]  Physical Therapy Treatment and Discharge Summary  Patient Details  Name: Grace Morales MRN: [id number] Date of Birth: 1956-02-14 Referring Provider (PT): Leta Baptist, MD   Encounter Date: 01/26/2020   PT End of Session - 01/26/20 1356    Visit Number 4    Number of Visits 6    Date for PT Re-Evaluation 02/09/20    PT Start Time 2010    PT Stop Time 1430    PT Time Calculation (min) 42 min    Activity Tolerance Patient tolerated treatment well    Behavior During Therapy [facility] for tasks assessed/performed           Past Medical History:  Diagnosis Date  . Anxiety   . Hx of colonic polyp - sessile serrated polyp 04/06/2015  . Spondylolisthesis   . Vitamin D deficiency     Past Surgical History:  Procedure Laterality Date  . BREAST EXCISIONAL BIOPSY Right 2002  . COLONOSCOPY    . GYNECOLOGIC CRYOSURGERY  1987  . TUBAL LIGATION      There were no vitals filed for this visit.   Subjective Assessment - 01/26/20 1351    Subjective The patient reports that she has made significant progress with ther ex.  She reports overall symptoms can get up to 1-2/10.  She notes ability to do turns.  Gaze adpatation and horizontal head turns continue to provoke symptoms.    Pertinent History patient notes she has spondylolisthesis (has lateral thigh numbness);    Patient Stated Goals learn how to compensate for dizziness    Currently in Pain? No/denies                   Vestibular Assessment - 01/26/20 0001      Visual Acuity   Static line 7    Dynamic line 5   improved from line 1 at evaluation!                   [facility] Adult PT Treatment/Exercise - 01/26/20 1617      Self-Care   Self-Care Other Self-Care Comments    Other Self-Care Comments  discussed how to progress through HEP and spent time answering  questions regarding condition and self mgmt of symptoms.            Vestibular Treatment/Exercise - 01/26/20 1402      Vestibular Treatment/Exercise   Vestibular Treatment Provided Gaze;Habituation    Habituation Exercises Standing Horizontal Head Turns    Gaze Exercises X1 Viewing Horizontal      Standing Horizontal Head Turns   Number of Reps  10    Symptom Description  with feet narrow      X1 Viewing Horizontal   Foot Position standing    Comments 60 seconds at slower pace with dizziness rated 4-5/10 at end of exercise.  This sensation settles within 1 minute of stopping exercise.  Then did fast pace x 20 seconds      X1 Viewing Vertical   Comments discontinued with patient noting no difficulty during HEP                 PT Education - 01/26/20 1614    Education Details discussed progression of current HEP    Person(s) Educated Patient    Methods Explanation;Demonstration;Handout    Comprehension Returned demonstration;Verbalized understanding  PT Long Term Goals - 01/26/20 1401      PT LONG TERM GOAL #1   Title The patient will be indep with HEP for motion sensitivity, gaze adaptation, haibituation and multi-sensory balance challenges.    Time 6    Period Weeks    Target Date 02/09/20      PT LONG TERM GOAL #2   Title The patient will tolerate gaze x 1 adaptation horiz plane x 60 seconds with change in symptoms < or equal to 2/10.    Baseline Can tolerate one minute and gets 4-5 symptoms that settle within 1 minute.    Time 6    Period Weeks    Status Partially Met      PT LONG TERM GOAL #3   Title The patient will tolerate standing horiz head turns x 10 reps with dizziness < or equal to 2/10.    Baseline settles within 10 seconds    Time 6    Period Weeks    Status Partially Met      PT LONG TERM GOAL #4   Title The patient will improve SVA versus DVA to < or equal to 4 line difference to demo vestibular compensation.    Baseline  6 line difference at eval, down to 2 line difference at renewal    Time 6    Period Weeks    Status Achieved      PT LONG TERM GOAL #5   Title The patient will subjectively report dizziness improved by 40% to demo dec'd functional limitation from symtpoms.    Baseline Patient reports 70% improvement    Time 6    Period Weeks    Status Achieved                 Plan - 01/26/20 1615    Clinical Impression Statement The patient is meeting LTGs early.  We discussed progression of HEP and follow up with PT in 1 month to continue to progress to her tolerance.  Overall, she notes symptoms improved by 70%.    Stability/Clinical Decision Making Stable/Uncomplicated    Rehab Potential Good    PT Frequency 1x / week    PT Duration 6 weeks    PT Treatment/Interventions ADLs/Self Care Home Management;Gait training;Patient/family education;Canalith Repostioning;Vestibular;Therapeutic exercise;Balance training;Neuromuscular re-education;Therapeutic activities;Manual techniques    PT Next Visit Plan follow-up in 30 days to check progress    PT Home Exercise Plan Access Code: [email or web address]    Consulted and Agree with Plan of Care Patient           Patient will benefit from skilled therapeutic intervention in order to improve the following deficits and impairments:  Dizziness, Decreased activity tolerance, Decreased balance  Visit Diagnosis: Dizziness and giddiness  Other abnormalities of gait and mobility     Problem List Patient Active Problem List   Diagnosis Date Noted  . Hx of colonic polyp - sessile serrated polyp 04/06/2015  . Family history of colon cancer elderly mother and elderly paternal grandfather 03/29/2015  . Situational anxiety 05/05/2014  . Osteopenia 11/07/2012   PHYSICAL THERAPY DISCHARGE SUMMARY  Visits from Start of Care: 4  Current functional level related to goals / functional outcomes: See goals above   Remaining deficits: Home program-- status not known  /  See above for last known status   Education / Equipment: Home progra  Plan: Patient agrees to discharge.  Patient goals were partially met. Patient is being discharged due to meeting the stated  rehab goals.  ?????         Thank you for the referral of this patient. Rudell Cobb, MPT   [place], [place] 01/26/2020, 4:17 PM  [facility] [place], [place], [address] Phone: [phone]   Fax:  [phone]  Name: Grace Morales MRN: [id number] Date of Birth: Jul 10, 1956

## 2020-01-26 NOTE — Patient Instructions (Signed)
Access Code: [email or web address] URL: [email or web address] Date: 01/26/2020 Prepared by: Rudell Cobb  Program Notes Progression:  continue doing the letter exercise 2x/day and the other 3 exercises 1x/day for the next 3 weeks.   If you continue to feel improvement in day to day activities, reduce frequency of letter exercise to 1x/day and others to 3 days/week.   If you continue to feel improvement in day to day activities, reduce frequency of all exercises to 2days/week. Then to none. *This would be done over a 4 week period.     Exercises Standing Gaze Stabilization with Head Rotation - 2-3 x daily - 7 x weekly - 2 sets - 20-30 reps Standing with Head Rotation - 1 x daily - 7 x weekly - 1 sets - 10 reps Turning in Corner 360 - 1 x daily - 7 x weekly - 1 sets - 3 reps Ball Toss with Eye Tracking - 1 x daily - 7 x weekly - 1 sets - 10 reps

## 2020-02-23 ENCOUNTER — Encounter: Payer: BC Managed Care – PPO | Admitting: Rehabilitative and Restorative Service Providers"

## 2020-04-01 ENCOUNTER — Other Ambulatory Visit: Payer: Self-pay | Admitting: Obstetrics & Gynecology

## 2020-04-01 DIAGNOSIS — Z1231 Encounter for screening mammogram for malignant neoplasm of breast: Secondary | ICD-10-CM

## 2020-04-01 NOTE — Progress Notes (Signed)
64 y.o. G0P0 Married White or Caucasian female here for annual exam.  Had some vertigo issues earlier this year.  Did PT and this really helped.  Has follow up in December.    Her dad passed in January.  He is buried at the col[facility]barium at [facility].  Denies vaginal bleeding.  No SA as husband at prostatectomy.    Tyrer Cusick model calculation today is 16.5% lifelime  Biggest complaint is sleep.  Does take restoril.  Tries not to use this.   Patient's last menstrual period was 07/11/2011.          Sexually active: Yes.    The current method of family planning is post menopausal status.    Exercising: Yes.    strength training & cardio Smoker:  no  Health Maintenance: Pap:  11-05-17 neg HPV HR neg History of abnormal Pap:  yes MMG:  05-05-2019 category b density birads 1:neg Colonoscopy:  03-29-15 polyp f/u 69yrs Dr Carlean Purl BMD:   2018 osteopenia TDaP:  To check with pcp Pneumonia vaccine(s):  Not done Shingrix:   Done 2020 Hep C testing: done per patient Screening Labs: plan with PCP   reports that she has never smoked. She has never used smokeless tobacco. She reports current alcohol use of about 3.0 - 4.0 standard drinks of alcohol per week. She reports that she does not use drugs.  Past Medical History:  Diagnosis Date  . Anxiety   . Hx of colonic polyp - sessile serrated polyp 04/06/2015  . Spondylolisthesis   . Vitamin D deficiency     Past Surgical History:  Procedure Laterality Date  . BREAST EXCISIONAL BIOPSY Right 2002  . COLONOSCOPY    . GYNECOLOGIC CRYOSURGERY  1987  . TUBAL LIGATION      Current Outpatient Medications  Medication Sig Dispense Refill  . Cholecalciferol 4000 units CAPS Take 1 capsule by mouth daily.    . Cyanocobalamin (B-12 PO) Take by mouth.    . famotidine (PEPCID) 20 MG tablet Take 20 mg by mouth at bedtime.    . fluticasone (FLONASE) 50 MCG/ACT nasal spray Place into both nostrils daily.    Marland Kitchen loratadine (CLARITIN) 10 MG tablet Take 10 mg by  mouth daily.    . psyllium (METAMUCIL) 58.6 % packet Take 1 packet by mouth daily.    . temazepam (RESTORIL) 15 MG capsule TAKE 1-2 CAPSULES NIGHTLY AS NEEDED FOR SLEEP 45 capsule 1   No current facility-administered medications for this visit.    Family History  Problem Relation Age of Onset  . Diabetes Maternal Grandfather   . Diabetes Father        controlled by diet  . Hypertension Father        ?  Marland Kitchen Heart disease Father        bypass surgery and pacemaker  . Kidney disease Father   . Breast cancer Mother 39  . Osteoporosis Mother   . Colon cancer Mother   . Other Mother        Dec from blood disorder  . Breast cancer Paternal Grandmother 22       had mastectomy in 1940 or 1950 ? if secondary to cancer  . Colon cancer Paternal Grandfather 24    Review of Systems  Constitutional: Negative.   HENT: Negative.   Eyes: Negative.   Respiratory: Negative.   Cardiovascular: Negative.   Gastrointestinal: Negative.   Endocrine: Negative.   Genitourinary: Negative.   Musculoskeletal: Negative.   Skin: Negative.  Allergic/Immunologic: Negative.   Neurological: Negative.   Hematological: Negative.   Psychiatric/Behavioral: Negative.     Exam:   BP 120/80   Pulse 70   Resp 16   Ht 5' 2.25" (1.581 m)   Wt 153 lb (69.4 kg)   LMP 07/11/2011   BMI 27.76 kg/m   Height: 5' 2.25" (158.1 cm)  General appearance: alert, cooperative and appears stated age Head: Normocephalic, without obvious abnormality, atraumatic Neck: no adenopathy, supple, symmetrical, trachea midline and thyroid normal to inspection and palpation Lungs: clear to auscultation bilaterally Breasts: normal appearance, no masses or tenderness Heart: regular rate and rhythm Abdomen: soft, non-tender; bowel sounds normal; no masses,  no organomegaly Extremities: extremities normal, atraumatic, no cyanosis or edema Skin: Skin color, texture, turgor normal. No rashes or lesions Lymph nodes: Cervical,  supraclavicular, and axillary nodes normal. No abnormal inguinal nodes palpated Neurologic: Grossly normal   Pelvic: External genitalia:  no lesions              Urethra:  normal appearing urethra with no masses, tenderness or lesions              Bartholins and Skenes: normal                 Vagina: normal appearing vagina with normal color and discharge, no lesions              Cervix: no lesions              Pap taken: No. Bimanual Exam:  Uterus:  normal size, contour, position, consistency, mobility, non-tender              Adnexa: normal adnexa and no mass, fullness, tenderness               Rectovaginal: Confirms               Anus:  normal sphincter tone, no lesions  Chaperone, Terence Lux, CMA, was present for exam.  A:  Well Woman with normal exam PMP, no HRT Remote hx of abnormal pap smear in 1987 Family hx of colon cancer and breast cancer in her mother Mild osteopenia insomnia  P:   Mammogram guidelines reviewed.  Limited breast MRI discussed today. pap smear neg with neg HR HPV 2019.  Not indicated today. Will plan lab work with PCP Colonoscopy is due Vaccines are up to date Will try trazodone 50mg  nightly prn insomnia.  #30/1RF return annually or prn

## 2020-04-02 ENCOUNTER — Ambulatory Visit: Payer: BC Managed Care – PPO | Admitting: Obstetrics & Gynecology

## 2020-04-02 ENCOUNTER — Other Ambulatory Visit: Payer: Self-pay

## 2020-04-02 ENCOUNTER — Encounter: Payer: Self-pay | Admitting: Obstetrics & Gynecology

## 2020-04-02 VITALS — BP 120/80 | HR 70 | Resp 16 | Ht 62.25 in | Wt 153.0 lb

## 2020-04-02 DIAGNOSIS — Z01419 Encounter for gynecological examination (general) (routine) without abnormal findings: Secondary | ICD-10-CM

## 2020-04-02 MED ORDER — TRAZODONE HCL 50 MG PO TABS: 50.0000 mg | ORAL_TABLET | Freq: Every day | ORAL | 1 refills | Status: DC

## 2020-04-12 ENCOUNTER — Telehealth: Payer: Self-pay | Admitting: Obstetrics & Gynecology

## 2020-04-12 NOTE — Telephone Encounter (Signed)
Immunizations updated.   AEX 04/02/20  Routing to Dr. Lestine Box.   Encounter closed.

## 2020-04-12 NOTE — Telephone Encounter (Signed)
Patient called to report the last date of her tdap was 11/03/2016 with Dr.Wong [facility]. No need to return her call.

## 2020-04-26 ENCOUNTER — Other Ambulatory Visit: Payer: Self-pay | Admitting: Obstetrics & Gynecology

## 2020-04-26 DIAGNOSIS — G4700 Insomnia, unspecified: Secondary | ICD-10-CM

## 2020-04-26 NOTE — Telephone Encounter (Signed)
Medication refill request: Trazadone 50mg   Last AEX:  04/02/20 Next AEX: not yet scheduled Last MMG (if hormonal medication request): NA Refill authorized: 90/0   Rx was sent in 04/02/20 - requesting a 90 day supply instead of 30

## 2020-05-06 ENCOUNTER — Ambulatory Visit: Payer: BC Managed Care – PPO

## 2020-06-10 ENCOUNTER — Other Ambulatory Visit: Payer: Self-pay

## 2020-06-10 ENCOUNTER — Ambulatory Visit
Admission: RE | Admit: 2020-06-10 | Discharge: 2020-06-10 | Disposition: A | Discharge status: Home / Self Care | Payer: BC Managed Care – PPO | Source: Ambulatory Visit | Attending: Obstetrics & Gynecology | Admitting: Obstetrics & Gynecology

## 2020-06-10 DIAGNOSIS — Z1231 Encounter for screening mammogram for malignant neoplasm of breast: Secondary | ICD-10-CM

## 2020-07-22 ENCOUNTER — Other Ambulatory Visit: Payer: Self-pay | Admitting: Obstetrics & Gynecology

## 2020-07-22 MED ORDER — TEMAZEPAM 15 MG PO CAPS: ORAL_CAPSULE | ORAL | 0 refills | Status: DC

## 2020-07-22 MED ORDER — BUPROPION HCL ER (XL) 150 MG PO TB24: 150.0000 mg | ORAL_TABLET | Freq: Every day | ORAL | 2 refills | Status: DC

## 2020-07-23 ENCOUNTER — Other Ambulatory Visit: Payer: Self-pay | Admitting: Obstetrics & Gynecology

## 2020-07-23 DIAGNOSIS — G4700 Insomnia, unspecified: Secondary | ICD-10-CM

## 2020-07-23 MED ORDER — BUPROPION HCL ER (XL) 150 MG PO TB24: 150.0000 mg | ORAL_TABLET | Freq: Every day | ORAL | 2 refills | Status: DC

## 2020-07-23 MED ORDER — TEMAZEPAM 15 MG PO CAPS: ORAL_CAPSULE | ORAL | 0 refills | Status: DC

## 2020-08-19 ENCOUNTER — Other Ambulatory Visit: Payer: Self-pay | Admitting: Obstetrics & Gynecology

## 2020-08-24 ENCOUNTER — Encounter (HOSPITAL_BASED_OUTPATIENT_CLINIC_OR_DEPARTMENT_OTHER): Payer: Self-pay

## 2020-08-26 ENCOUNTER — Other Ambulatory Visit (HOSPITAL_BASED_OUTPATIENT_CLINIC_OR_DEPARTMENT_OTHER): Payer: Self-pay | Admitting: Obstetrics & Gynecology

## 2020-08-26 MED ORDER — BUPROPION HCL ER (XL) 150 MG PO TB24: 150.0000 mg | ORAL_TABLET | Freq: Every day | ORAL | 1 refills | Status: DC

## 2020-09-03 ENCOUNTER — Other Ambulatory Visit: Payer: Self-pay | Admitting: Obstetrics & Gynecology

## 2020-09-06 ENCOUNTER — Encounter (HOSPITAL_BASED_OUTPATIENT_CLINIC_OR_DEPARTMENT_OTHER): Payer: Self-pay | Admitting: Family Medicine

## 2020-09-06 ENCOUNTER — Ambulatory Visit (INDEPENDENT_AMBULATORY_CARE_PROVIDER_SITE_OTHER): Payer: BC Managed Care – PPO | Admitting: Family Medicine

## 2020-09-06 ENCOUNTER — Other Ambulatory Visit: Payer: Self-pay

## 2020-09-06 VITALS — BP 152/84 | HR 92 | Ht 62.5 in | Wt 153.0 lb

## 2020-09-06 DIAGNOSIS — M4310 Spondylolisthesis, site unspecified: Secondary | ICD-10-CM | POA: Insufficient documentation

## 2020-09-06 DIAGNOSIS — Z7689 Persons encountering health services in other specified circumstances: Secondary | ICD-10-CM

## 2020-09-06 DIAGNOSIS — E785 Hyperlipidemia, unspecified: Secondary | ICD-10-CM | POA: Diagnosis not present

## 2020-09-06 DIAGNOSIS — Z6827 Body mass index (BMI) 27.0-27.9, adult: Secondary | ICD-10-CM

## 2020-09-06 DIAGNOSIS — D649 Anemia, unspecified: Secondary | ICD-10-CM | POA: Insufficient documentation

## 2020-09-06 DIAGNOSIS — R030 Elevated blood-pressure reading, without diagnosis of hypertension: Secondary | ICD-10-CM

## 2020-09-06 DIAGNOSIS — S39011A Strain of muscle, fascia and tendon of abdomen, initial encounter: Secondary | ICD-10-CM

## 2020-09-06 NOTE — Assessment & Plan Note (Addendum)
Noted on review of prior labs Will recheck today Continue with lifestyle modifications, exercise as above

## 2020-09-06 NOTE — Assessment & Plan Note (Signed)
Blood pressure slightly elevated today Patient denies any issues with sustained elevated blood pressure in the past, does report that she occasionally has high blood pressure readings during visits to the doctor's office Most recent blood pressure at prior visit with specialist was normal Discussed lifestyle modifications, will continue to monitor, no medications needed for this at this time

## 2020-09-06 NOTE — Progress Notes (Signed)
New Patient Office Visit  Subjective:  Patient ID: Grace Morales, female    DOB: 02/25/1956  Age: 65 y.o. MRN: [id number]  CC:  Chief Complaint  Patient presents with  . New to establish  . Abdominal Pain    Lower abdominal pain that radiates to lower back. Thinks she may have strained doing exercise with trainer. (kettle bell swings)    HPI RHENDA [place] presents to establish care and also has concerns of lower abdominal pain.  Patient with history of hyperlipidemia and anemia on prior labs.  Abdominal pain started about 2 weeks ago following workout with personal trainer.  This started while doing kettle bell swings, feels that she may have strained a muscle.  Denies any issues with bowel movements, urinary symptoms.  No nausea, vomiting, diarrhea or constipation pain worse with sitting up in bed Has noticed some improvement since initial injury  Hyperlipidemia and anemia noted on prior labs, no significant issues as far as lightheadedness, dizziness.  Denies any chest pain or shortness of breath.  Past Medical History:  Diagnosis Date  . Anxiety   . Hx of colonic polyp - sessile serrated polyp 04/06/2015  . Spondylolisthesis   . Vitamin D deficiency     Past Surgical History:  Procedure Laterality Date  . BREAST EXCISIONAL BIOPSY Right 2002  . COLONOSCOPY    . GYNECOLOGIC CRYOSURGERY  1987  . TUBAL LIGATION      Family History  Problem Relation Age of Onset  . Diabetes Maternal Grandfather   . Diabetes Father        controlled by diet  . Hypertension Father        ?  Marland Kitchen Heart disease Father        bypass surgery and pacemaker  . Kidney disease Father   . Breast cancer Mother 67  . Osteoporosis Mother   . Colon cancer Mother   . Other Mother        Dec from blood disorder  . Breast cancer Paternal Grandmother 55       had mastectomy in 1940 or 1950 ? if secondary to cancer  . Colon cancer Paternal Grandfather 74    Social History    Socioeconomic History  . Marital status: Married    Spouse name: Not on file  . Number of children: Not on file  . Years of education: Not on file  . Highest education level: Not on file  Occupational History  . Not on file  Tobacco Use  . Smoking status: Never Smoker  . Smokeless tobacco: Never Used  Vaping Use  . Vaping Use: Never used  Substance and Sexual Activity  . Alcohol use: Yes    Alcohol/week: 3.0 - 4.0 standard drinks    Types: 3 - 4 Glasses of wine per week  . Drug use: No  . Sexual activity: Yes    Partners: Male    Birth control/protection: Post-menopausal, Surgical    Comment: BTL  Other Topics Concern  . Not on file  Social History Narrative  . Not on file   Social Determinants of Health   Financial Resource Strain: Not on file  Food Insecurity: Not on file  Transportation Needs: Not on file  Physical Activity: Not on file  Stress: Not on file  Social Connections: Not on file  Intimate Partner Violence: Not on file    ROS Review of Systems  Constitutional: Negative.   HENT: Negative.   Respiratory: Negative.   Cardiovascular:  Negative.   Gastrointestinal: Positive for abdominal pain. Negative for constipation, diarrhea, nausea and vomiting.  Genitourinary: Negative.   Musculoskeletal: Negative.   Neurological: Negative.   Hematological: Negative.   Psychiatric/Behavioral: Negative.       Objective:   Today's Vitals: BP (!) 152/84   Pulse 92   Ht 5' 2.5" (1.588 m)   Wt 153 lb (69.4 kg)   LMP 07/11/2011   SpO2 98%   BMI 27.54 kg/m   Physical Exam  Very pleasant 65 year old female in no acute distress Mild tenderness in the lower abdomen, no tenderness over ASIS bilaterally Normal bowel sounds, no organomegaly No pain with active hip flexion Mild pain elicited when sitting up from exam table No significant tenderness on palpation over spinous processes in the lumbar region or paraspinal muscles  Assessment & Plan:   Problem  List Items Addressed This Visit      Musculoskeletal and Integument   Abdominal muscle strain, initial encounter    Abdominal pain seems likely related to muscle strain and less likely due to GI or GU etiology Has had some improvement since initial injury Recommend continuing with conservative measures, gradual increasing activities as tolerated        Other   Hyperlipidemia, unspecified    Noted on review of prior labs Will recheck today Continue with lifestyle modifications, exercise as above      Anemia, unspecified    Mild, noted on prior labs Denies any symptoms currently Will recheck on labs today      Body mass index (BMI) 27.0-27.9, adult    Continue with lifestyle modifications, encouraged to continue with present exercise regimen Will continue to monitor at future visits      Elevated BP without diagnosis of hypertension    Blood pressure slightly elevated today Patient denies any issues with sustained elevated blood pressure in the past, does report that she occasionally has high blood pressure readings during visits to the doctor's office Most recent blood pressure at prior visit with specialist was normal Discussed lifestyle modifications, will continue to monitor, no medications needed for this at this time       Other Visit Diagnoses    Encounter to establish care    -  Primary   Relevant Orders   Basic Metabolic Panel (BMET)   CBC with Differential/Platelet   Cholesterol, Total      Outpatient Encounter Medications as of 09/06/2020  Medication Sig  . buPROPion (WELLBUTRIN XL) 150 MG 24 hr tablet Take 1 tablet (150 mg total) by mouth daily.  . Cholecalciferol 4000 units CAPS Take 1 capsule by mouth daily.  . famotidine (PEPCID) 20 MG tablet Take 20 mg by mouth at bedtime.  . fluticasone (FLONASE) 50 MCG/ACT nasal spray Place into both nostrils daily.  Marland Kitchen loratadine (CLARITIN) 10 MG tablet Take 10 mg by mouth daily.  . psyllium (METAMUCIL) 58.6 % packet  Take 1 packet by mouth daily.  . temazepam (RESTORIL) 15 MG capsule TAKE 1-2 CAPSULES NIGHTLY AS NEEDED FOR SLEEP  . [DISCONTINUED] Cyanocobalamin (B-12 PO) Take by mouth. (Patient not taking: Reported on 09/06/2020)   No facility-administered encounter medications on file as of 09/06/2020.    Follow-up: Return in about 1 year (around 09/06/2021).   Annya Lizana J De [place], MD

## 2020-09-06 NOTE — Assessment & Plan Note (Signed)
Abdominal pain seems likely related to muscle strain and less likely due to GI or GU etiology Has had some improvement since initial injury Recommend continuing with conservative measures, gradual increasing activities as tolerated

## 2020-09-06 NOTE — Assessment & Plan Note (Signed)
Continue with lifestyle modifications, encouraged to continue with present exercise regimen Will continue to monitor at future visits

## 2020-09-06 NOTE — Patient Instructions (Signed)
Medication Instructions:  Your physician recommends that you continue on your current medications as directed. Please refer to the Current Medication list given to you today. *If you need a refill on any your medications before your next appointment, please call your pharmacy first. If no refills are authorized on file call the office.*  Lab Work: Your physician recommends that you have lab work today: BMET, CBC, and Lipid Panel.  If you have labs (blood work) drawn today and your tests are completely normal, you will receive your results only by: Marland Kitchen MyChart Message (if you have MyChart) OR . A phone call from our staff. Please ensure you check your voicemail in the event that you authorized detailed messages to be left on a delegated number. If you have any lab test that is abnormal or we need to change your treatment, we will call you to review the results.   We recommend signing up for the patient portal called "MyChart".  Sign up information is provided on this After Visit Summary.  MyChart is used to connect with patients for Virtual Visits (Telemedicine).  Patients are able to view lab/test results, encounter notes, upcoming appointments, etc.  Non-urgent messages can be sent to your provider as well.   To learn more about what you can do with MyChart, go to [email or web address].

## 2020-09-06 NOTE — Assessment & Plan Note (Signed)
Mild, noted on prior labs Denies any symptoms currently Will recheck on labs today

## 2020-09-07 ENCOUNTER — Telehealth (HOSPITAL_BASED_OUTPATIENT_CLINIC_OR_DEPARTMENT_OTHER): Payer: Self-pay

## 2020-09-07 LAB — CBC WITH DIFFERENTIAL/PLATELET
Basophils Absolute: 0 10*3/uL (ref 0.0–0.2)
Basos: 0 %
EOS (ABSOLUTE): 0.1 10*3/uL (ref 0.0–0.4)
Eos: 2 %
Hematocrit: 40 % (ref 34.0–46.6)
Hemoglobin: 13.2 g/dL (ref 11.1–15.9)
Immature Grans (Abs): 0 10*3/uL (ref 0.0–0.1)
Immature Granulocytes: 0 %
Lymphocytes Absolute: 1.5 10*3/uL (ref 0.7–3.1)
Lymphs: 29 %
MCH: 29.3 pg (ref 26.6–33.0)
MCHC: 33 g/dL (ref 31.5–35.7)
MCV: 89 fL (ref 79–97)
Monocytes Absolute: 0.3 10*3/uL (ref 0.1–0.9)
Monocytes: 7 %
Neutrophils Absolute: 3.1 10*3/uL (ref 1.4–7.0)
Neutrophils: 62 %
Platelets: 283 10*3/uL (ref 150–450)
RBC: 4.51 x10E6/uL (ref 3.77–5.28)
RDW: 13.5 % (ref 11.7–15.4)
WBC: 5 10*3/uL (ref 3.4–10.8)

## 2020-09-07 LAB — BASIC METABOLIC PANEL
BUN/Creatinine Ratio: 16 (ref 12–28)
BUN: 15 mg/dL (ref 8–27)
CO2: 21 mmol/L (ref 20–29)
Calcium: 10.1 mg/dL (ref 8.7–10.3)
Chloride: 101 mmol/L (ref 96–106)
Creatinine, Ser: 0.93 mg/dL (ref 0.57–1.00)
Glucose: 96 mg/dL (ref 65–99)
Potassium: 3.9 mmol/L (ref 3.5–5.2)
Sodium: 138 mmol/L (ref 134–144)
eGFR: 69 mL/min/{1.73_m2} (ref >59)

## 2020-09-07 LAB — CHOLESTEROL, TOTAL: Cholesterol, Total: 257 mg/dL — ABNORMAL HIGH (ref 100–199)

## 2020-09-07 NOTE — Telephone Encounter (Signed)
Pt results realeased to mychart by Dr. De [place]. Instructed patient to contact the office if she has any questions or concerns.

## 2020-09-07 NOTE — Telephone Encounter (Signed)
-----   Message from Raymond J de Guam, MD sent at 09/07/2020 12:11 PM EST ----- Lab results overall looked good! The mild anemia seen on prior labs is now normal. Cholesterol level is similar to prior results - recommend continuing with your current exercise regimen and incorporating fresh fruits and vegetables and whole grains into your diet.

## 2020-09-23 ENCOUNTER — Telehealth (HOSPITAL_BASED_OUTPATIENT_CLINIC_OR_DEPARTMENT_OTHER): Payer: Self-pay

## 2020-09-23 NOTE — Telephone Encounter (Signed)
error 

## 2020-10-25 ENCOUNTER — Telehealth (HOSPITAL_BASED_OUTPATIENT_CLINIC_OR_DEPARTMENT_OTHER): Payer: Self-pay

## 2020-10-25 NOTE — Telephone Encounter (Signed)
Patient called in to inform Dr. De [place] that she tested positive for Covid 19 last night Patient wants to know if there are any precautions or instructions she needs to be following Confirmed with Dr. [place] patient doesn't need any special precautions other then supportive measures for symptoms. Informed patient to contact the office if symptoms worsen

## 2020-10-31 ENCOUNTER — Encounter: Payer: Self-pay | Admitting: Internal Medicine

## 2021-03-04 ENCOUNTER — Other Ambulatory Visit (HOSPITAL_BASED_OUTPATIENT_CLINIC_OR_DEPARTMENT_OTHER): Payer: Self-pay | Admitting: Obstetrics & Gynecology

## 2021-04-04 ENCOUNTER — Other Ambulatory Visit (HOSPITAL_COMMUNITY)
Admission: RE | Admit: 2021-04-04 | Discharge: 2021-04-04 | Disposition: A | Discharge status: Home / Self Care | Payer: Medicare PPO | Source: Ambulatory Visit | Attending: Obstetrics & Gynecology | Admitting: Obstetrics & Gynecology

## 2021-04-04 ENCOUNTER — Ambulatory Visit (INDEPENDENT_AMBULATORY_CARE_PROVIDER_SITE_OTHER): Payer: Medicare PPO | Admitting: Obstetrics & Gynecology

## 2021-04-04 ENCOUNTER — Other Ambulatory Visit: Payer: Self-pay

## 2021-04-04 ENCOUNTER — Encounter (HOSPITAL_BASED_OUTPATIENT_CLINIC_OR_DEPARTMENT_OTHER): Payer: Self-pay | Admitting: Obstetrics & Gynecology

## 2021-04-04 VITALS — BP 150/91 | HR 97 | Ht 62.5 in | Wt 154.0 lb

## 2021-04-04 DIAGNOSIS — F5101 Primary insomnia: Secondary | ICD-10-CM | POA: Diagnosis not present

## 2021-04-04 DIAGNOSIS — Z780 Asymptomatic menopausal state: Secondary | ICD-10-CM

## 2021-04-04 DIAGNOSIS — F418 Other specified anxiety disorders: Secondary | ICD-10-CM

## 2021-04-04 DIAGNOSIS — M8580 Other specified disorders of bone density and structure, unspecified site: Secondary | ICD-10-CM

## 2021-04-04 DIAGNOSIS — Z800 Family history of malignant neoplasm of digestive organs: Secondary | ICD-10-CM

## 2021-04-04 DIAGNOSIS — R030 Elevated blood-pressure reading, without diagnosis of hypertension: Secondary | ICD-10-CM | POA: Diagnosis not present

## 2021-04-04 DIAGNOSIS — Z01419 Encounter for gynecological examination (general) (routine) without abnormal findings: Secondary | ICD-10-CM | POA: Diagnosis not present

## 2021-04-04 DIAGNOSIS — Z9189 Other specified personal risk factors, not elsewhere classified: Secondary | ICD-10-CM

## 2021-04-04 DIAGNOSIS — Z124 Encounter for screening for malignant neoplasm of cervix: Secondary | ICD-10-CM

## 2021-04-04 MED ORDER — TEMAZEPAM 15 MG PO CAPS: ORAL_CAPSULE | ORAL | 0 refills | Status: DC

## 2021-04-04 NOTE — Progress Notes (Signed)
65 y.o. G0P0 Married White or Caucasian female here for breast and pelvic exam.  I am also following her for increased risk of breast cancer.  We discussed this last year and she is ready to proceed with screening breast MRI this year.    Denies vaginal bleeding.  Had lab work done in February.  Total cholesterol is 257.  BP always a little up at doctor's office.  Does check at home and this is always normal.    Patient's last menstrual period was 07/11/2011.          Sexually active: No.  He had prostate surgery.   H/O STD:  no  Health Maintenance: PCP:  Dr. [place].  Last wellness appt was 08/2020.  Did blood work at that appt:   Vaccines are up to date:  yes Colonoscopy:  03/29/2015, pt aware this is due MMG:  06/10/2020 Negative BMD:  12/19/2016 Last pap smear:  11/05/2017 Negative.   H/o abnormal pap smear:  remote hx in the 80's   reports that she has never smoked. She has never used smokeless tobacco. She reports current alcohol use of about 3.0 - 4.0 standard drinks per week. She reports that she does not use drugs.  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     famotidine (PEPCID) 20 MG tablet Take 20 mg by mouth at bedtime.     fluticasone (FLONASE) 50 MCG/ACT nasal spray Place into both nostrils daily.     loratadine (CLARITIN) 10 MG tablet Take 10 mg by mouth daily.     psyllium (METAMUCIL) 58.6 % packet Take 1 packet by mouth daily.     temazepam (RESTORIL) 15 MG capsule TAKE  1-2 CAPSULES NIGHTLY AS NEEDED FOR SLEEP 45 capsule 0   No current facility-administered medications for this visit.    Family History  Problem Relation Age of Onset   Diabetes Maternal Grandfather    Diabetes Father        controlled by diet   Hypertension Father        ?   Heart disease Father        bypass surgery and pacemaker   Kidney disease Father    Breast cancer Mother 26   Osteoporosis Mother    Colon cancer Mother    Other Mother        Dec from blood disorder   Breast cancer Paternal Grandmother 65       had mastectomy in 63 or 1950 ? if secondary to cancer   Colon cancer Paternal Grandfather 24    Review of Systems  Constitutional: Negative.   Gastrointestinal: Negative.   Genitourinary: Negative.    Exam:   BP (!) 150/91 (BP Location: Right Arm, Patient Position: Sitting, Cuff Size: Large)   Pulse 97   Ht 5' 2.5" (1.588 m)   Wt 154 lb (69.9 kg)   LMP 07/11/2011  BMI 27.72 kg/m   Height: 5' 2.5" (158.8 cm)  General appearance: alert, cooperative and appears stated age Breasts: normal appearance, no masses or tenderness Abdomen: soft, non-tender; bowel sounds normal; no masses,  no organomegaly Lymph nodes: Cervical, supraclavicular, and axillary nodes normal.  No abnormal inguinal nodes palpated Neurologic: Grossly normal  Pelvic: External genitalia:  no lesions              Urethra:  normal appearing urethra with no masses, tenderness or lesions              Bartholins and Skenes: normal                 Vagina: normal appearing vagina with atrophic changes and no discharge, no lesions              Cervix: no lesions              Pap taken: Yes.   Bimanual Exam:  Uterus:  normal size, contour, position, consistency, mobility, non-tender              Adnexa: normal adnexa and no mass, fullness, tenderness               Rectovaginal: Confirms               Anus:  normal sphincter tone, no lesions  Chaperone, Octaviano Batty, CMA, was present for  exam.  Assessment/Plan: 1. Gynecologic exam normal - pap smear obtained today - MMG up to date - pt aware colonoscopy is due - bone density ordered for 2023 - Care Gaps updated/reviewed  2. Postmenopausal - no HRT  3. Increased risk of breast cancer - MR BREAST W & WO CM SCREENING (GI); Future  4. Family history of colon cancer elderly mother and elderly paternal grandfather  55. Primary insomnia - temazepam (RESTORIL) 15 MG capsule; TAKE 1-2 CAPSULES NIGHTLY AS NEEDED FOR SLEEP  Dispense: 45 capsule; Refill: 0  6. Situational anxiety - Does not need RF for Wellbutrin at this time  7. Elevated BP without diagnosis of hypertension - pt monitors at home

## 2021-04-05 ENCOUNTER — Encounter (HOSPITAL_BASED_OUTPATIENT_CLINIC_OR_DEPARTMENT_OTHER): Payer: Self-pay

## 2021-04-05 LAB — CYTOLOGY - PAP: Diagnosis: NEGATIVE

## 2021-04-14 ENCOUNTER — Encounter: Payer: Self-pay | Admitting: Internal Medicine

## 2021-04-20 ENCOUNTER — Encounter (HOSPITAL_BASED_OUTPATIENT_CLINIC_OR_DEPARTMENT_OTHER): Payer: Self-pay | Admitting: *Deleted

## 2021-05-02 ENCOUNTER — Other Ambulatory Visit: Payer: Self-pay | Admitting: Obstetrics & Gynecology

## 2021-05-02 DIAGNOSIS — Z1231 Encounter for screening mammogram for malignant neoplasm of breast: Secondary | ICD-10-CM

## 2021-05-06 ENCOUNTER — Other Ambulatory Visit: Payer: Self-pay

## 2021-05-06 ENCOUNTER — Ambulatory Visit
Admission: RE | Admit: 2021-05-06 | Discharge: 2021-05-06 | Disposition: A | Discharge status: Home / Self Care | Payer: No Typology Code available for payment source | Source: Ambulatory Visit | Attending: Obstetrics & Gynecology | Admitting: Obstetrics & Gynecology

## 2021-05-06 DIAGNOSIS — Z9189 Other specified personal risk factors, not elsewhere classified: Secondary | ICD-10-CM

## 2021-05-06 MED ORDER — GADOBUTROL 1 MMOL/ML IV SOLN
7.0000 mL | Freq: Once | INTRAVENOUS | Status: AC | PRN
  Administered 2021-05-06: 7 mL via INTRAVENOUS

## 2021-06-13 ENCOUNTER — Encounter: Payer: Self-pay | Admitting: Internal Medicine

## 2021-06-13 ENCOUNTER — Ambulatory Visit (AMBULATORY_SURGERY_CENTER): Payer: Self-pay

## 2021-06-13 ENCOUNTER — Other Ambulatory Visit: Payer: Self-pay

## 2021-06-13 VITALS — Ht 63.0 in | Wt 157.0 lb

## 2021-06-13 DIAGNOSIS — Z800 Family history of malignant neoplasm of digestive organs: Secondary | ICD-10-CM

## 2021-06-13 DIAGNOSIS — Z86010 Personal history of colonic polyps: Secondary | ICD-10-CM

## 2021-06-13 MED ORDER — SUTAB 1479-225-188 MG PO TABS: 1.0000 | ORAL_TABLET | ORAL | 0 refills | Status: DC

## 2021-06-13 NOTE — Progress Notes (Signed)
No egg or soy allergy known to patient  No issues known to pt with past sedation with any surgeries or procedures----other than PONV Patient denies ever being told they had issues or difficulty with intubation ----other than PONV No FH of Malignant Hyperthermia Pt is not on diet pills Pt is not on home 02  Pt is not on blood thinners  Pt denies issues with constipation at this time;  No A fib or A flutter Pt is fully vaccinated for Covid x 2; Medicare coupon given to pt in [place] today and NO PA's for preps discussed with pt in [facility] today  Discussed with pt there will be an out-of-pocket cost for prep and that varies from $0 to 70 +  dollars - pt verbalized understanding   Due to the COVID-19 pandemic we are asking patients to follow certain guidelines in [place] and the [place]   Pt aware of COVID protocols and [place] guidelines   Patient has requested to have Sutab due to patient reports she is not able to tolerate the liquid preps (causes nausea/vomiting)

## 2021-06-27 ENCOUNTER — Other Ambulatory Visit: Payer: Self-pay

## 2021-06-27 ENCOUNTER — Encounter: Payer: Medicare PPO | Admitting: Internal Medicine

## 2021-06-27 ENCOUNTER — Ambulatory Visit (AMBULATORY_SURGERY_CENTER): Payer: Medicare PPO | Admitting: Internal Medicine

## 2021-06-27 ENCOUNTER — Encounter: Payer: Self-pay | Admitting: Internal Medicine

## 2021-06-27 VITALS — BP 133/66 | HR 70 | Temp 97.5°F | Resp 16 | Ht 63.0 in | Wt 157.0 lb

## 2021-06-27 DIAGNOSIS — D122 Benign neoplasm of ascending colon: Secondary | ICD-10-CM

## 2021-06-27 DIAGNOSIS — K635 Polyp of colon: Secondary | ICD-10-CM | POA: Diagnosis not present

## 2021-06-27 DIAGNOSIS — Z800 Family history of malignant neoplasm of digestive organs: Secondary | ICD-10-CM

## 2021-06-27 DIAGNOSIS — D120 Benign neoplasm of cecum: Secondary | ICD-10-CM

## 2021-06-27 DIAGNOSIS — Z86010 Personal history of colonic polyps: Secondary | ICD-10-CM | POA: Diagnosis not present

## 2021-06-27 MED ORDER — SODIUM CHLORIDE 0.9 % IV SOLN: 500.0000 mL | Freq: Once | INTRAVENOUS | Status: DC

## 2021-06-27 NOTE — Progress Notes (Signed)
Called to room to assist during endoscopic procedure.  Patient ID and intended procedure confirmed with present staff. Received instructions for my participation in the procedure from the performing physician.  

## 2021-06-27 NOTE — Progress Notes (Signed)
To PACU, VSS. Report to [email or web address] 

## 2021-06-27 NOTE — Patient Instructions (Addendum)
Two tiny polyps this time - all else ok.  I will let you know pathology results and when to have another routine colonoscopy by mail and/or My Chart.  I appreciate the opportunity to care for you. Gatha Mayer, MD, FACG   YOU HAD AN ENDOSCOPIC PROCEDURE TODAY AT [facility]:   Refer to the procedure report that was given to you for any specific questions about what was found during the examination.  If the procedure report does not answer your questions, please call your gastroenterologist to clarify.  If you requested that your care partner not be given the details of your procedure findings, then the procedure report has been included in a sealed envelope for you to review at your convenience later.  YOU SHOULD EXPECT: Some feelings of bloating in the abdomen. Passage of more gas than usual.  Walking can help get rid of the air that was put into your GI tract during the procedure and reduce the bloating. If you had a lower endoscopy (such as a colonoscopy or flexible sigmoidoscopy) you may notice spotting of blood in your stool or on the toilet paper. If you underwent a bowel prep for your procedure, you may not have a normal bowel movement for a few days.  Please Note:  You might notice some irritation and congestion in your nose or some drainage.  This is from the oxygen used during your procedure.  There is no need for concern and it should clear up in a day or so.  SYMPTOMS TO REPORT IMMEDIATELY:  Following lower endoscopy (colonoscopy or flexible sigmoidoscopy):  Excessive amounts of blood in the stool  Significant tenderness or worsening of abdominal pains  Swelling of the abdomen that is new, acute  Fever of 100F or higher  For urgent or emergent issues, a gastroenterologist can be reached at any hour by calling [phone]. Do not use MyChart messaging for urgent concerns.    DIET:  We do recommend a small meal at first, but then you may proceed to your  regular diet.  Drink plenty of fluids but you should avoid alcoholic beverages for 24 hours.  ACTIVITY:  You should plan to take it easy for the rest of today and you should NOT DRIVE or use heavy machinery until tomorrow (because of the sedation medicines used during the test).    FOLLOW UP: Our staff will call the number listed on your records 48-72 hours following your procedure to check on you and address any questions or concerns that you may have regarding the information given to you following your procedure. If we do not reach you, we will leave a message.  We will attempt to reach you two times.  During this call, we will ask if you have developed any symptoms of COVID 19. If you develop any symptoms (ie: fever, flu-like symptoms, shortness of breath, cough etc.) before then, please call [phone].  If you test positive for Covid 19 in the 2 weeks post procedure, please call and report this information to [place].    If any biopsies were taken you will be contacted by phone or by letter within the next 1-3 weeks.  Please call us at [phone] if you have not heard about the biopsies in 3 weeks.    SIGNATURES/CONFIDENTIALITY: You and/or your care partner have signed paperwork which will be entered into your electronic medical record.  These signatures attest to the fact that that the information above on your  After Visit Summary has been reviewed and is understood.  Full responsibility of the confidentiality of this discharge information lies with you and/or your care-partner.

## 2021-06-27 NOTE — Op Note (Signed)
[facility] Patient Name: Grace Morales Procedure Date: 06/27/2021 7:20 AM MRN: [id number] Endoscopist: Gatha Mayer , MD Age: 65 Referring MD:  Date of Birth: 07-07-1956 Gender: Female Account #: [id number] Procedure:                Colonoscopy Indications:              High risk colon cancer surveillance: Personal                            history of sessile serrated colon polyp (less than                            10 mm in size) with no dysplasia, Last colonoscopy:                            2016 Medicines:                Propofol per Anesthesia, Monitored Anesthesia Care Procedure:                Pre-Anesthesia Assessment:                           - Prior to the procedure, a History and Physical                            was performed, and patient medications and                            allergies were reviewed. The patient's tolerance of                            previous anesthesia was also reviewed. The risks                            and benefits of the procedure and the sedation                            options and risks were discussed with the patient.                            All questions were answered, and informed consent                            was obtained. Prior Anticoagulants: The patient has                            taken no previous anticoagulant or antiplatelet                            agents. ASA Grade Assessment: II - A patient with                            mild systemic disease. After reviewing the risks  and benefits, the patient was deemed in                            satisfactory condition to undergo the procedure.                           After obtaining informed consent, the colonoscope                            was passed under direct vision. Throughout the                            procedure, the patient's blood pressure, pulse, and                            oxygen saturations were  monitored continuously. The                            Olympus PCF-H190DL (#[id number]) Colonoscope was                            introduced through the anus and advanced to the the                            cecum, identified by appendiceal orifice and                            ileocecal valve. The colonoscopy was performed                            without difficulty. The patient tolerated the                            procedure well. The quality of the bowel                            preparation was good. The ileocecal valve,                            appendiceal orifice, and rectum were photographed.                            The bowel preparation used was SuTab via split dose                            instruction. Scope In: 8:01:37 AM Scope Out: 8:24:35 AM Scope Withdrawal Time: 0 hours 19 minutes 2 seconds  Total Procedure Duration: 0 hours 22 minutes 58 seconds  Findings:                 The perianal and digital rectal examinations were                            normal.  Two flat and sessile polyps were found in the                            ascending colon and cecum. The polyps were                            diminutive in size. These polyps were removed with                            a cold snare. Resection and retrieval were                            complete. Verification of patient identification                            for the specimen was done. Estimated blood loss was                            minimal.                           The exam was otherwise without abnormality on                            direct and retroflexion views. Complications:            No immediate complications. Estimated Blood Loss:     Estimated blood loss was minimal. Impression:               - Two diminutive polyps in the ascending colon and                            in the cecum, removed with a cold snare. Resected                            and  retrieved.                           - The examination was otherwise normal on direct                            and retroflexion views.                           - Personal history of colonic polyp diminutive ssp                            2016. Recommendation:           - Patient has a contact number available for                            emergencies. The signs and symptoms of potential                            delayed complications were discussed with  the                            patient. Return to normal activities tomorrow.                            Written discharge instructions were provided to the                            patient.                           - Resume previous diet.                           - Continue present medications.                           - Repeat colonoscopy is recommended for                            surveillance. The colonoscopy date will be                            determined after pathology results from today's                            exam become available for review. Gatha Mayer, MD 06/27/2021 8:35:54 AM This report has been signed electronically.

## 2021-06-27 NOTE — Progress Notes (Signed)
[facility] History and Physical   Primary Care Physician:  de [place], Blondell Reveal, MD   Reason for Procedure:   Hx colon polyp  Plan:    colonoscopy     HPI: Grace Morales is a 65 y.o. female w/ removal ssp polyp 2016.   Past Medical History:  Diagnosis Date   Anxiety    situational anxiety   GERD (gastroesophageal reflux disease)    with certain foods-OTC PRN meds   Hx of colonic polyp - sessile serrated polyp 04/06/2015   Hyperlipidemia    diet controlled-no meds   Osteopenia    Seasonal allergies    Spondylolisthesis    Vitamin D deficiency     Past Surgical History:  Procedure Laterality Date   BREAST EXCISIONAL BIOPSY Right 2002   BREAST LUMPECTOMY Right 2002   COLONOSCOPY  2016   CG-miralax (good)   [place] EXTRACTION      Prior to Admission medications   Medication Sig Start Date End Date Taking? Authorizing Provider  buPROPion (WELLBUTRIN XL) 150 MG 24 hr tablet TAKE 1 TABLET BY MOUTH EVERY DAY 03/04/21  Yes Megan Salon, MD  Cholecalciferol 4000 units CAPS Take 1 capsule by mouth daily.   Yes [provider]  fluticasone (FLONASE) 50 MCG/ACT nasal spray Place 2 sprays into both nostrils daily as needed.   Yes [provider]  PSYLLIUM PO Take 1 packet by mouth daily.   Yes [provider]  temazepam (RESTORIL) 15 MG capsule TAKE 1-2 CAPSULES NIGHTLY AS NEEDED FOR SLEEP 04/04/21  Yes Megan Salon, MD  famotidine (PEPCID) 20 MG tablet Take 20 mg by mouth daily as needed. 02/10/20   [provider]  loratadine (CLARITIN) 10 MG tablet Take 10 mg by mouth daily as needed.    [provider]    Current Outpatient Medications  Medication Sig Dispense Refill   buPROPion (WELLBUTRIN XL) 150 MG 24 hr tablet TAKE 1 TABLET BY MOUTH EVERY DAY 90 tablet 1   Cholecalciferol 4000 units CAPS Take 1 capsule by mouth daily.     fluticasone (FLONASE)  50 MCG/ACT nasal spray Place 2 sprays into both nostrils daily as needed.     PSYLLIUM PO Take 1 packet by mouth daily.     temazepam (RESTORIL) 15 MG capsule TAKE 1-2 CAPSULES NIGHTLY AS NEEDED FOR SLEEP 45 capsule 0   famotidine (PEPCID) 20 MG tablet Take 20 mg by mouth daily as needed.     loratadine (CLARITIN) 10 MG tablet Take 10 mg by mouth daily as needed.     Current Facility-Administered Medications  Medication Dose Route Frequency Provider Last Rate Last Admin   0.9 %  sodium chloride infusion  500 mL Intravenous Once Gatha Mayer, MD        Allergies as of 06/27/2021 - Review Complete 06/27/2021  Allergen Reaction Noted   Penicillins  09/25/2012   Sulfa antibiotics  09/25/2012    Family History  Problem Relation Age of Onset   Colon polyps Mother 30   Breast cancer Mother 57   Osteoporosis Mother    Colon cancer Mother 23   Other Mother        Dec from blood disorder   Diabetes Father        controlled by diet   Hypertension Father        ?   Heart disease Father  bypass surgery and pacemaker   Kidney disease Father    Diabetes Maternal Grandfather    Breast cancer Paternal Grandmother 76       had mastectomy in 24 or 1950 ? if secondary to cancer   Colon polyps Paternal Grandfather 8   Colon cancer Paternal Grandfather 64   Esophageal cancer Neg Hx    Rectal cancer Neg Hx    Stomach cancer Neg Hx     Social History   Socioeconomic History   Marital status: Married    Spouse name: Not on file   Number of children: Not on file   Years of education: Not on file   Highest education level: Not on file  Occupational History   Not on file  Tobacco Use   Smoking status: Never   Smokeless tobacco: Never  Vaping Use   Vaping Use: Never used  Substance and Sexual Activity   Alcohol use: Not Currently    Alcohol/week: 3.0 standard drinks    Types: 3 Glasses of wine per week   Drug use: No   Sexual activity: Yes    Partners: Male    Birth  control/protection: Post-menopausal, Surgical    Comment: BTL     Review of Systems:  other review of systems negative except as mentioned in the HPI.  Physical Exam: Vital signs BP (!) 156/90    Pulse 85    Temp (!) 97.5 F (36.4 C) (Temporal)    Resp 12    Ht 5\' 3"  (1.6 m)    Wt 157 lb (71.2 kg)    LMP 07/11/2011    SpO2 100%    BMI 27.81 kg/m   General:   Alert,  Well-developed, well-nourished, pleasant and cooperative in NAD Lungs:  Clear throughout to auscultation.   Heart:  Regular rate and rhythm; no murmurs, clicks, rubs,  or gallops. Abdomen:  Soft, nontender and nondistended. Normal bowel sounds.   Neuro/Psych:  Alert and cooperative. Normal mood and affect. A and O x 3   @Yohan Samons  Simonne Maffucci, MD, [facility] [phone] (pager) 06/27/2021 7:54 AM@

## 2021-06-27 NOTE — Progress Notes (Signed)
Pt's states no medical or surgical changes since previsit or office visit. 

## 2021-06-29 ENCOUNTER — Telehealth: Payer: Self-pay

## 2021-06-29 NOTE — Telephone Encounter (Signed)
°  Follow up Call-  Call back number 06/27/2021  Post procedure Call Back phone  # [phone]  Permission to leave phone message Yes  Some recent data might be hidden     Patient questions:  Do you have a fever, pain , or abdominal swelling? No. Pain Score  0 *  Have you tolerated food without any problems? Yes.    Have you been able to return to your normal activities? Yes.    Do you have any questions about your discharge instructions: Diet   No. Medications  No. Follow up visit  No.  Do you have questions or concerns about your Care? No.  Actions: * If pain score is 4 or above: No action needed, pain <4.

## 2021-07-11 ENCOUNTER — Encounter: Payer: Self-pay | Admitting: Internal Medicine

## 2021-08-29 ENCOUNTER — Ambulatory Visit (HOSPITAL_BASED_OUTPATIENT_CLINIC_OR_DEPARTMENT_OTHER): Payer: Medicare PPO | Admitting: Family Medicine

## 2021-08-29 ENCOUNTER — Other Ambulatory Visit: Payer: Self-pay

## 2021-08-29 ENCOUNTER — Encounter (HOSPITAL_BASED_OUTPATIENT_CLINIC_OR_DEPARTMENT_OTHER): Payer: Self-pay | Admitting: Family Medicine

## 2021-08-29 VITALS — BP 160/92 | HR 103 | Ht 63.0 in | Wt 152.0 lb

## 2021-08-29 DIAGNOSIS — M25551 Pain in right hip: Secondary | ICD-10-CM

## 2021-08-29 DIAGNOSIS — Z0000 Encounter for general adult medical examination without abnormal findings: Secondary | ICD-10-CM

## 2021-08-29 NOTE — Patient Instructions (Signed)
°  Medication Instructions:  Your physician recommends that you continue on your current medications as directed. Please refer to the Current Medication list given to you today. --If you need a refill on any your medications before your next appointment, please call your pharmacy first. If no refills are authorized on file   Follow-Up: Your next appointment:   Your physician recommends that you schedule a follow-up appointment in: 6-8 WEEKS for CPE with Dr. de [place]  You will receive a text message or e-mail with a link to a survey about your care and experience with [place] today! We would greatly appreciate your feedback!   Thanks for letting us be apart of your health journey!!  Primary Care and Sports Medicine   Dr. Arlina Robes [place]   We encourage you to activate your patient portal called "MyChart".  Sign up information is provided on this After Visit Summary.  MyChart is used to connect with patients for Virtual Visits (Telemedicine).  Patients are able to view lab/test results, encounter notes, upcoming appointments, etc.  Non-urgent messages can be sent to your provider as well. To learn more about what you can do with MyChart, please visit --  [email or web address].

## 2021-08-29 NOTE — Progress Notes (Signed)
° ° °  Procedures performed today:    Procedure: Injection of the right greater trochanteric bursa Verbal informed consent obtained.  Time-out conducted.  Noted no overlying erythema, induration, or other signs of local infection.  Skin prepped in a sterile fashion.  Local anesthesia: Topical Ethyl chloride.  With sterile technique: 2 cc Kenalog 40, 4 cc lidocaine injected easily Completed without difficulty  Advised to call if fevers/chills, erythema, induration, drainage, or persistent bleeding.  Impression: Technically successful injection.  Independent interpretation of notes and tests performed by another provider:   None.  Brief History, Exam, Impression, and Recommendations:    BP (!) 160/92    Pulse (!) 103    Ht 5\' 3"  (1.6 m)    Wt 152 lb (68.9 kg)    LMP 07/11/2011    SpO2 98%    BMI 26.93 kg/m   Greater trochanteric pain syndrome of right lower extremity Patient is 66 year old female presenting for evaluation of right hip pain, low back pain, lower abdominal discomfort. Reports that symptoms initially began about 6 to 8 weeks ago.  Only thing that she can recall around that time is that she had a colonoscopy and subsequently had some lower abdominal discomfort, gradually developed some low back pain, right hip pain.  Otherwise no inciting event recalled. Indicates right lateral hip, extending some down lateral leg through IT band area.  Also has some low back pain which has been present in the past.  Pain has not kept her from her desired activities including walking, gym.  No associated numbness or tingling. Since pain began, she has not had any change in symptoms, no better, no worse No specific treatments tried, does work with a Physiological scientist regularly On exam, normal active and passive range of motion.  Normal strength for flexion, abduction and adduction.  Does have tenderness to palpation over right greater trochanter, no significant tenderness to palpation along IT  band.  Negative Trendelenburg. Feel that right hip pain is most likely related to greater trochanteric pain syndrome which can possibly be contributing to pain experienced in other areas around the pelvis.  Discussed options with patient, given that she has been working with Physiological scientist and symptoms have persisted, she is interested in trochanteric bursa injection today, see procedure note as above. Discussed possibility of referral to physical therapy, given that she is working with personal trainer she would prefer to continue with that instead Discussed OTC measures which can be utilized Plan for follow-up in about 2 months for CPE, nurse visit for labs 1 week prior, will check on hip symptoms at that time.  If persisting, consider imaging   ___________________________________________ Grace Kruser [place], MD, ABFM, [facility] and [place]

## 2021-08-29 NOTE — Assessment & Plan Note (Signed)
Patient is 66 year old female presenting for evaluation of right hip pain, low back pain, lower abdominal discomfort. Reports that symptoms initially began about 6 to 8 weeks ago.  Only thing that she can recall around that time is that she had a colonoscopy and subsequently had some lower abdominal discomfort, gradually developed some low back pain, right hip pain.  Otherwise no inciting event recalled. Indicates right lateral hip, extending some down lateral leg through IT band area.  Also has some low back pain which has been present in the past.  Pain has not kept her from her desired activities including walking, gym.  No associated numbness or tingling. Since pain began, she has not had any change in symptoms, no better, no worse No specific treatments tried, does work with a Physiological scientist regularly On exam, normal active and passive range of motion.  Normal strength for flexion, abduction and adduction.  Does have tenderness to palpation over right greater trochanter, no significant tenderness to palpation along IT band.  Negative Trendelenburg. Feel that right hip pain is most likely related to greater trochanteric pain syndrome which can possibly be contributing to pain experienced in other areas around the pelvis.  Discussed options with patient, given that she has been working with Physiological scientist and symptoms have persisted, she is interested in trochanteric bursa injection today, see procedure note as above. Discussed possibility of referral to physical therapy, given that she is working with personal trainer she would prefer to continue with that instead Discussed OTC measures which can be utilized Plan for follow-up in about 2 months for CPE, nurse visit for labs 1 week prior, will check on hip symptoms at that time.  If persisting, consider imaging

## 2021-09-08 ENCOUNTER — Other Ambulatory Visit (HOSPITAL_BASED_OUTPATIENT_CLINIC_OR_DEPARTMENT_OTHER): Payer: Self-pay | Admitting: *Deleted

## 2021-09-08 MED ORDER — BUPROPION HCL ER (XL) 150 MG PO TB24: 150.0000 mg | ORAL_TABLET | Freq: Every day | ORAL | 1 refills | Status: DC

## 2021-10-10 DIAGNOSIS — H16223 Keratoconjunctivitis sicca, not specified as Sjogren's, bilateral: Secondary | ICD-10-CM | POA: Diagnosis not present

## 2021-10-10 DIAGNOSIS — H524 Presbyopia: Secondary | ICD-10-CM | POA: Diagnosis not present

## 2021-10-10 DIAGNOSIS — H04123 Dry eye syndrome of bilateral lacrimal glands: Secondary | ICD-10-CM | POA: Diagnosis not present

## 2021-10-10 DIAGNOSIS — H25813 Combined forms of age-related cataract, bilateral: Secondary | ICD-10-CM | POA: Diagnosis not present

## 2021-10-17 ENCOUNTER — Ambulatory Visit (HOSPITAL_BASED_OUTPATIENT_CLINIC_OR_DEPARTMENT_OTHER): Payer: Medicare PPO

## 2021-10-17 ENCOUNTER — Other Ambulatory Visit (HOSPITAL_BASED_OUTPATIENT_CLINIC_OR_DEPARTMENT_OTHER): Payer: Self-pay | Admitting: Family Medicine

## 2021-10-17 DIAGNOSIS — Z0000 Encounter for general adult medical examination without abnormal findings: Secondary | ICD-10-CM | POA: Diagnosis not present

## 2021-10-18 LAB — CBC WITH DIFFERENTIAL/PLATELET
Basophils Absolute: 0 10*3/uL (ref 0.0–0.2)
Basos: 1 %
EOS (ABSOLUTE): 0.1 10*3/uL (ref 0.0–0.4)
Eos: 1 %
Hematocrit: 40.9 % (ref 34.0–46.6)
Hemoglobin: 13.5 g/dL (ref 11.1–15.9)
Immature Grans (Abs): 0 10*3/uL (ref 0.0–0.1)
Immature Granulocytes: 0 %
Lymphocytes Absolute: 1.6 10*3/uL (ref 0.7–3.1)
Lymphs: 31 %
MCH: 29.8 pg (ref 26.6–33.0)
MCHC: 33 g/dL (ref 31.5–35.7)
MCV: 90 fL (ref 79–97)
Monocytes Absolute: 0.4 10*3/uL (ref 0.1–0.9)
Monocytes: 7 %
Neutrophils Absolute: 3 10*3/uL (ref 1.4–7.0)
Neutrophils: 60 %
Platelets: 285 10*3/uL (ref 150–450)
RBC: 4.53 x10E6/uL (ref 3.77–5.28)
RDW: 13.8 % (ref 11.7–15.4)
WBC: 5.1 10*3/uL (ref 3.4–10.8)

## 2021-10-18 LAB — COMPREHENSIVE METABOLIC PANEL
ALT: 23 IU/L (ref 0–32)
AST: 21 IU/L (ref 0–40)
Albumin/Globulin Ratio: 1.9 (ref 1.2–2.2)
Albumin: 4.6 g/dL (ref 3.8–4.8)
Alkaline Phosphatase: 53 IU/L (ref 44–121)
BUN/Creatinine Ratio: 14 (ref 12–28)
BUN: 14 mg/dL (ref 8–27)
Bilirubin Total: 0.3 mg/dL (ref 0.0–1.2)
CO2: 23 mmol/L (ref 20–29)
Calcium: 9.8 mg/dL (ref 8.7–10.3)
Chloride: 102 mmol/L (ref 96–106)
Creatinine, Ser: 1 mg/dL (ref 0.57–1.00)
Globulin, Total: 2.4 g/dL (ref 1.5–4.5)
Glucose: 96 mg/dL (ref 70–99)
Potassium: 5.4 mmol/L — ABNORMAL HIGH (ref 3.5–5.2)
Sodium: 139 mmol/L (ref 134–144)
Total Protein: 7 g/dL (ref 6.0–8.5)
eGFR: 63 mL/min/{1.73_m2} (ref >59)

## 2021-10-18 LAB — HEMOGLOBIN A1C
Est. average glucose Bld gHb Est-mCnc: 123 mg/dL
Hgb A1c MFr Bld: 5.9 % — ABNORMAL HIGH (ref 4.8–5.6)

## 2021-10-18 LAB — TSH RFX ON ABNORMAL TO FREE T4: TSH: 3.01 u[IU]/mL (ref 0.450–4.500)

## 2021-10-18 LAB — LIPID PANEL
Chol/HDL Ratio: 2.9 ratio (ref 0.0–4.4)
Cholesterol, Total: 244 mg/dL — ABNORMAL HIGH (ref 100–199)
HDL: 83 mg/dL (ref >39)
LDL Chol Calc (NIH): 148 mg/dL — ABNORMAL HIGH (ref 0–99)
Triglycerides: 76 mg/dL (ref 0–149)
VLDL Cholesterol Cal: 13 mg/dL (ref 5–40)

## 2021-10-18 LAB — VITAMIN B12: Vitamin B-12: >2000 pg/mL — ABNORMAL HIGH (ref 232–1245)

## 2021-10-24 ENCOUNTER — Ambulatory Visit (INDEPENDENT_AMBULATORY_CARE_PROVIDER_SITE_OTHER): Payer: Medicare PPO | Admitting: Family Medicine

## 2021-10-24 ENCOUNTER — Encounter (HOSPITAL_BASED_OUTPATIENT_CLINIC_OR_DEPARTMENT_OTHER): Payer: Self-pay | Admitting: Family Medicine

## 2021-10-24 VITALS — BP 131/77 | HR 112 | Temp 97.7°F | Ht 63.0 in | Wt 145.4 lb

## 2021-10-24 DIAGNOSIS — E559 Vitamin D deficiency, unspecified: Secondary | ICD-10-CM | POA: Insufficient documentation

## 2021-10-24 DIAGNOSIS — H9313 Tinnitus, bilateral: Secondary | ICD-10-CM | POA: Insufficient documentation

## 2021-10-24 DIAGNOSIS — Z0000 Encounter for general adult medical examination without abnormal findings: Secondary | ICD-10-CM

## 2021-10-24 DIAGNOSIS — M4310 Spondylolisthesis, site unspecified: Secondary | ICD-10-CM

## 2021-10-24 DIAGNOSIS — E875 Hyperkalemia: Secondary | ICD-10-CM

## 2021-10-24 DIAGNOSIS — Z780 Asymptomatic menopausal state: Secondary | ICD-10-CM | POA: Insufficient documentation

## 2021-10-24 MED ORDER — MECLIZINE HCL 25 MG PO TABS: 25.0000 mg | ORAL_TABLET | Freq: Three times a day (TID) | ORAL | 0 refills | Status: DC | PRN

## 2021-10-24 NOTE — Patient Instructions (Signed)
Preventive Care 69 Years and Older, Female ?Preventive care refers to lifestyle choices and visits with your health care provider that can promote health and wellness. Preventive care visits are also called wellness exams. ?What can I expect for my preventive care visit? ?Counseling ?Your health care provider may ask you questions about your: ?Medical history, including: ?Past medical problems. ?Family medical history. ?Pregnancy and menstrual history. ?History of falls. ?Current health, including: ?Memory and ability to understand (cognition). ?Emotional well-being. ?Home life and relationship well-being. ?Sexual activity and sexual health. ?Lifestyle, including: ?Alcohol, nicotine or tobacco, and drug use. ?Access to firearms. ?Diet, exercise, and sleep habits. ?Work and work Statistician. ?Sunscreen use. ?Safety issues such as seatbelt and bike helmet use. ?Physical exam ?Your health care provider will check your: ?Height and weight. These may be used to calculate your BMI (body mass index). BMI is a measurement that tells if you are at a healthy weight. ?Waist circumference. This measures the distance around your waistline. This measurement also tells if you are at a healthy weight and may help predict your risk of certain diseases, such as type 2 diabetes and high blood pressure. ?Heart rate and blood pressure. ?Body temperature. ?Skin for abnormal spots. ?What immunizations do I need? ? ?Vaccines are usually given at various ages, according to a schedule. Your health care provider will recommend vaccines for you based on your age, medical history, and lifestyle or other factors, such as travel or where you work. ?What tests do I need? ?Screening ?Your health care provider may recommend screening tests for certain conditions. This may include: ?Lipid and cholesterol levels. ?Hepatitis C test. ?Hepatitis B test. ?HIV (human immunodeficiency virus) test. ?STI (sexually transmitted infection) testing, if you are at  risk. ?Lung cancer screening. ?Colorectal cancer screening. ?Diabetes screening. This is done by checking your blood sugar (glucose) after you have not eaten for a while (fasting). ?Mammogram. Talk with your health care provider about how often you should have regular mammograms. ?BRCA-related cancer screening. This may be done if you have a family history of breast, ovarian, tubal, or peritoneal cancers. ?Bone density scan. This is done to screen for osteoporosis. ?Talk with your health care provider about your test results, treatment options, and if necessary, the need for more tests. ?Follow these instructions at home: ?Eating and drinking ? ?Eat a diet that includes fresh fruits and vegetables, whole grains, lean protein, and low-fat dairy products. Limit your intake of foods with high amounts of sugar, saturated fats, and salt. ?Take vitamin and mineral supplements as recommended by your health care provider. ?Do not drink alcohol if your health care provider tells you not to drink. ?If you drink alcohol: ?Limit how much you have to 0-1 drink a day. ?Know how much alcohol is in your drink. In the [place] one drink equals one 12 oz bottle of beer (355 mL), one 5 oz glass of wine (148 mL), or one 1? oz glass of hard liquor (44 mL). ?Lifestyle ?Brush your teeth every morning and night with fluoride toothpaste. Floss one time each day. ?Exercise for at least 30 minutes 5 or more days each week. ?Do not use any products that contain nicotine or tobacco. These products include cigarettes, chewing tobacco, and vaping devices, such as e-cigarettes. If you need help quitting, ask your health care provider. ?Do not use drugs. ?If you are sexually active, practice safe sex. Use a condom or other form of protection in order to prevent STIs. ?Take aspirin only as told by  your health care provider. Make sure that you understand how much to take and what form to take. Work with your health care provider to find out whether it  is safe and beneficial for you to take aspirin daily. ?Ask your health care provider if you need to take a cholesterol-lowering medicine (statin). ?Find healthy ways to manage stress, such as: ?Meditation, yoga, or listening to music. ?Journaling. ?Talking to a trusted person. ?Spending time with friends and family. ?Minimize exposure to UV radiation to reduce your risk of skin cancer. ?Safety ?Always wear your seat belt while driving or riding in a vehicle. ?Do not drive: ?If you have been drinking alcohol. Do not ride with someone who has been drinking. ?When you are tired or distracted. ?While texting. ?If you have been using any mind-altering substances or drugs. ?Wear a helmet and other protective equipment during sports activities. ?If you have firearms in your house, make sure you follow all gun safety procedures. ?What's next? ?Visit your health care provider once a year for an annual wellness visit. ?Ask your health care provider how often you should have your eyes and teeth checked. ?Stay up to date on all vaccines. ?This information is not intended to replace advice given to you by your health care provider. Make sure you discuss any questions you have with your health care provider. ?Document Revised: 12/22/2020 Document Reviewed: 12/22/2020 ?Elsevier Patient Education ? [place]. ? ?

## 2021-10-24 NOTE — Assessment & Plan Note (Addendum)
Routine HCM labs ordered. HCM reviewed/discussed. Anticipatory guidance regarding healthy weight, lifestyle and choices given. ?Recommend healthy diet.  Recommend approximately 150 minutes/week of moderate intensity exercise ?Recommend regular dental and vision exams ?Always use seatbelt/lap and shoulder restraints ?Recommend using smoke alarms and checking batteries at least twice a year ?Recommend using sunscreen when outside ?She is generally up-to-date on most recommended health maintenance measures including colonoscopy, tetanus booster, cervical cancer screening, breast cancer screening ?She also reports receiving pneumococcal vaccination last year through local pharmacy ?

## 2021-10-24 NOTE — Assessment & Plan Note (Signed)
Patient indicates that she has some low back pain with symptoms extending into left lower extremity including pain, numbness, tingling.  She reports that she has had prior issues with this in the past.  Does have document covering evaluation from 2016.  She also indicates having an MRI in the past which she indicates had some mention of spondylolisthesis as well as disc bulging.  She had physical therapy at that time with some improvement then.  She does indicate that symptoms have become a bit more persistent for her.  She has not had any gait issues or reported strength concerns.  No bowel or bladder incontinence. ?Discussed options the patient, given that it is causing more impairment in regards to quality of life, can proceed with further evaluation with specialist. ?She will discuss further with a friend of hers who has worked with providers at [facility] and she will let us know who she would like to be referred to ?

## 2021-10-24 NOTE — Progress Notes (Signed)
?Subjective:   ? ?CC: Annual Physical Exam ? ?HPI:  ?Grace Morales is a 66 y.o. presenting for annual physical ? ?I reviewed the past medical history, family history, social history, surgical history, and allergies today and no changes were needed.  Please see the problem list section below in epic for further details. ? ?Past Medical History: ?Past Medical History:  ?Diagnosis Date  ? Anxiety   ? situational anxiety  ? GERD (gastroesophageal reflux disease)   ? with certain foods-OTC PRN meds  ? Hx of colonic polyp - sessile serrated polyp 04/06/2015  ? Hyperlipidemia   ? diet controlled-no meds  ? Osteopenia   ? Seasonal allergies   ? Spondylolisthesis   ? Vitamin D deficiency   ? ?Past Surgical History: ?Past Surgical History:  ?Procedure Laterality Date  ? BREAST EXCISIONAL BIOPSY Right 2002  ? BREAST LUMPECTOMY Right 2002  ? COLONOSCOPY  2016  ? CG-miralax (good)  ? GYNECOLOGIC CRYOSURGERY  1987  ? TUBAL LIGATION  1990  ? WISDOM TOOTH EXTRACTION    ? ?Social History: ?Social History  ? ?Socioeconomic History  ? Marital status: Married  ?  Spouse name: Not on file  ? Number of children: Not on file  ? Years of education: Not on file  ? Highest education level: Not on file  ?Occupational History  ? Not on file  ?Tobacco Use  ? Smoking status: Never  ? Smokeless tobacco: Never  ?Vaping Use  ? Vaping Use: Never used  ?Substance and Sexual Activity  ? Alcohol use: Not Currently  ?  Alcohol/week: 3.0 standard drinks  ?  Types: 3 Glasses of wine per week  ? Drug use: No  ? Sexual activity: Yes  ?  Partners: Male  ?  Birth control/protection: Post-menopausal, Surgical  ?  Comment: BTL  ?Other Topics Concern  ? Not on file  ?Social History Narrative  ? Not on file  ? ?Social Determinants of Health  ? ?Financial Resource Strain: Not on file  ?Food Insecurity: Not on file  ?Transportation Needs: Not on file  ?Physical Activity: Not on file  ?Stress: Not on file  ?Social Connections: Not on file  ? ?Family  History: ?Family History  ?Problem Relation Age of Onset  ? Colon polyps Mother 33  ? Breast cancer Mother 61  ? Osteoporosis Mother   ? Colon cancer Mother 13  ? Other Mother   ?     Dec from blood disorder  ? Diabetes Father   ?     controlled by diet  ? Hypertension Father   ?     ?  ? Heart disease Father   ?     bypass surgery and pacemaker  ? Kidney disease Father   ? Diabetes Maternal Grandfather   ? Breast cancer Paternal Grandmother 42  ?     had mastectomy in 1940 or 1950 ? if secondary to cancer  ? Colon polyps Paternal Grandfather 40  ? Colon cancer Paternal Grandfather 24  ? Esophageal cancer Neg Hx   ? Rectal cancer Neg Hx   ? Stomach cancer Neg Hx   ? ?Allergies: ?Allergies  ?Allergen Reactions  ? Penicillins   ?  RASH  ? Sulfa Antibiotics   ? ?Medications: See med rec. ? ?Review of Systems: No headache, visual changes, nausea, vomiting, diarrhea, constipation, dizziness, abdominal pain, skin rash, fevers, chills, night sweats, swollen lymph nodes, weight loss, chest pain, body aches, joint swelling, muscle aches, shortness of breath,  mood changes, visual or auditory hallucinations. ? ?Objective:   ? ?BP 131/77   Pulse (!) 112   Temp 97.7 ?F (36.5 ?C)   Ht '5\' 3"'$  (1.6 m)   Wt 145 lb 6.4 oz (66 kg)   LMP 07/11/2011   SpO2 100%   BMI 25.76 kg/m?  ? ?General: Well Developed, well nourished, and in no acute distress.  ?Neuro: Alert and oriented x3, extra-ocular muscles intact, sensation grossly intact. Cranial nerves II through XII are intact, motor, sensory, and coordinative functions are all intact. ?HEENT: Normocephalic, atraumatic, pupils equal round reactive to light, neck supple, no masses, no lymphadenopathy, thyroid nonpalpable. Oropharynx, nasopharynx, external ear canals are unremarkable. ?Skin: Warm and dry, no rashes noted.  ?Cardiac: Regular rate and rhythm, no murmurs rubs or gallops.  ?Respiratory: Clear to auscultation bilaterally. Not using accessory muscles, speaking in full  sentences.  ?Abdominal: Soft, nontender, nondistended, positive bowel sounds, no masses, no organomegaly.  ?Musculoskeletal: Shoulder, elbow, wrist, hip, knee, ankle stable, and with full range of motion. ? ?Impression and Recommendations:   ? ?Wellness examination ?Routine HCM labs ordered. HCM reviewed/discussed. Anticipatory guidance regarding healthy weight, lifestyle and choices given. ?Recommend healthy diet.  Recommend approximately 150 minutes/week of moderate intensity exercise ?Recommend regular dental and vision exams ?Always use seatbelt/lap and shoulder restraints ?Recommend using smoke alarms and checking batteries at least twice a year ?Recommend using sunscreen when outside ?She is generally up-to-date on most recommended health maintenance measures including colonoscopy, tetanus booster, cervical cancer screening, breast cancer screening ?She also reports receiving pneumococcal vaccination last year through local pharmacy ? ?Acquired spondylolisthesis ?Patient indicates that she has some low back pain with symptoms extending into left lower extremity including pain, numbness, tingling.  She reports that she has had prior issues with this in the past.  Does have document covering evaluation from 2016.  She also indicates having an MRI in the past which she indicates had some mention of spondylolisthesis as well as disc bulging.  She had physical therapy at that time with some improvement then.  She does indicate that symptoms have become a bit more persistent for her.  She has not had any gait issues or reported strength concerns.  No bowel or bladder incontinence. ?Discussed options the patient, given that it is causing more impairment in regards to quality of life, can proceed with further evaluation with specialist. ?She will discuss further with a friend of hers who has worked with providers at [facility] and she will let us know who she would like to be referred to ? ?Plan for follow-up in 1  year ?Will have patient complete BMP in 1 to 2 months to ensure normalization of electrolytes ? ? ?___________________________________________ ?Grace Weimer [place], MD, ABFM, CAQSM ?Primary Care and Sports Medicine ?[facility] ?

## 2021-10-26 ENCOUNTER — Ambulatory Visit
Admission: RE | Admit: 2021-10-26 | Discharge: 2021-10-26 | Disposition: A | Discharge status: Home / Self Care | Payer: Medicare PPO | Source: Ambulatory Visit | Attending: Obstetrics & Gynecology | Admitting: Obstetrics & Gynecology

## 2021-10-26 ENCOUNTER — Encounter (HOSPITAL_BASED_OUTPATIENT_CLINIC_OR_DEPARTMENT_OTHER): Payer: Self-pay | Admitting: Family Medicine

## 2021-10-26 DIAGNOSIS — Z1231 Encounter for screening mammogram for malignant neoplasm of breast: Secondary | ICD-10-CM | POA: Diagnosis not present

## 2021-10-26 DIAGNOSIS — M8580 Other specified disorders of bone density and structure, unspecified site: Secondary | ICD-10-CM

## 2021-10-26 DIAGNOSIS — M8589 Other specified disorders of bone density and structure, multiple sites: Secondary | ICD-10-CM | POA: Diagnosis not present

## 2021-10-26 DIAGNOSIS — Z780 Asymptomatic menopausal state: Secondary | ICD-10-CM | POA: Diagnosis not present

## 2021-11-02 DIAGNOSIS — C44729 Squamous cell carcinoma of skin of left lower limb, including hip: Secondary | ICD-10-CM | POA: Diagnosis not present

## 2021-11-24 ENCOUNTER — Encounter (HOSPITAL_BASED_OUTPATIENT_CLINIC_OR_DEPARTMENT_OTHER): Payer: Self-pay | Admitting: Obstetrics & Gynecology

## 2021-11-24 ENCOUNTER — Ambulatory Visit (HOSPITAL_BASED_OUTPATIENT_CLINIC_OR_DEPARTMENT_OTHER): Payer: Medicare PPO

## 2021-11-24 ENCOUNTER — Other Ambulatory Visit (HOSPITAL_BASED_OUTPATIENT_CLINIC_OR_DEPARTMENT_OTHER): Payer: Self-pay | Admitting: Family Medicine

## 2021-11-24 DIAGNOSIS — E875 Hyperkalemia: Secondary | ICD-10-CM | POA: Diagnosis not present

## 2021-11-25 LAB — BASIC METABOLIC PANEL
BUN/Creatinine Ratio: 18 (ref 12–28)
BUN: 17 mg/dL (ref 8–27)
CO2: 24 mmol/L (ref 20–29)
Calcium: 9.7 mg/dL (ref 8.7–10.3)
Chloride: 105 mmol/L (ref 96–106)
Creatinine, Ser: 0.97 mg/dL (ref 0.57–1.00)
Glucose: 94 mg/dL (ref 70–99)
Potassium: 5.3 mmol/L — ABNORMAL HIGH (ref 3.5–5.2)
Sodium: 142 mmol/L (ref 134–144)
eGFR: 65 mL/min/{1.73_m2} (ref >59)

## 2022-03-02 ENCOUNTER — Other Ambulatory Visit (HOSPITAL_BASED_OUTPATIENT_CLINIC_OR_DEPARTMENT_OTHER): Payer: Self-pay | Admitting: *Deleted

## 2022-03-02 MED ORDER — BUPROPION HCL ER (XL) 150 MG PO TB24: 150.0000 mg | ORAL_TABLET | Freq: Every day | ORAL | 1 refills | Status: DC

## 2022-03-02 NOTE — Progress Notes (Signed)
Pharmacy requests refill on buproprion '150mg'$ . Rx sent.

## 2022-03-21 DIAGNOSIS — L814 Other melanin hyperpigmentation: Secondary | ICD-10-CM | POA: Diagnosis not present

## 2022-03-21 DIAGNOSIS — L821 Other seborrheic keratosis: Secondary | ICD-10-CM | POA: Diagnosis not present

## 2022-03-21 DIAGNOSIS — L720 Epidermal cyst: Secondary | ICD-10-CM | POA: Diagnosis not present

## 2022-03-21 DIAGNOSIS — Z85828 Personal history of other malignant neoplasm of skin: Secondary | ICD-10-CM | POA: Diagnosis not present

## 2022-04-07 ENCOUNTER — Ambulatory Visit (HOSPITAL_BASED_OUTPATIENT_CLINIC_OR_DEPARTMENT_OTHER): Payer: Medicare PPO | Admitting: Obstetrics & Gynecology

## 2022-05-25 ENCOUNTER — Encounter (HOSPITAL_BASED_OUTPATIENT_CLINIC_OR_DEPARTMENT_OTHER): Payer: Self-pay | Admitting: Obstetrics & Gynecology

## 2022-05-25 ENCOUNTER — Ambulatory Visit (INDEPENDENT_AMBULATORY_CARE_PROVIDER_SITE_OTHER): Payer: Medicare PPO | Admitting: Obstetrics & Gynecology

## 2022-05-25 VITALS — BP 139/67 | HR 96 | Ht 62.25 in | Wt 152.6 lb

## 2022-05-25 DIAGNOSIS — Z01419 Encounter for gynecological examination (general) (routine) without abnormal findings: Secondary | ICD-10-CM | POA: Diagnosis not present

## 2022-05-25 DIAGNOSIS — Z8659 Personal history of other mental and behavioral disorders: Secondary | ICD-10-CM | POA: Diagnosis not present

## 2022-05-25 DIAGNOSIS — M8580 Other specified disorders of bone density and structure, unspecified site: Secondary | ICD-10-CM

## 2022-05-25 DIAGNOSIS — F5101 Primary insomnia: Secondary | ICD-10-CM | POA: Diagnosis not present

## 2022-05-25 DIAGNOSIS — Z803 Family history of malignant neoplasm of breast: Secondary | ICD-10-CM | POA: Diagnosis not present

## 2022-05-25 MED ORDER — TEMAZEPAM 15 MG PO CAPS: ORAL_CAPSULE | ORAL | 0 refills | Status: DC

## 2022-05-25 MED ORDER — BUPROPION HCL ER (XL) 150 MG PO TB24: 150.0000 mg | ORAL_TABLET | Freq: Every day | ORAL | 3 refills | Status: DC

## 2022-05-25 NOTE — Progress Notes (Signed)
66 y.o. G0P0 Married White or Caucasian female here for breast and pelvic exam.  I am also following her for osteopenia and postmenopausal concerns.  Last BMD was 10/2021 showing osteopenia.  She's been increasing her calcium intake.   They've been traveling a lot for fun.  They've been to [place] this year and they are going back in March   Family history of breast cancer in her mother and paternal GM.  Has breast MRI last year.  Will plan next year.  Denies vaginal bleeding.  On wellbutrin and needs refill.  No issues.  Also does use restoring occasionally for sleep.  Does need RF as well.  Patient's last menstrual period was 07/11/2011.          Sexually active: No.  H/O STD:  no  Health Maintenance: PCP:  [place].  Last wellness appt was 10/2021.  Did blood work at that appt: yes Vaccines are up to date:  yes Colonoscopy:  06/27/2021 MMG:  10/26/2021 Negative BMD:  10/26/2021 Osteopenia Last pap smear:  04/04/2021 Negative.   H/o abnormal pap smear:  remote hx in the 80's    reports that she has never smoked. She has never used smokeless tobacco. She reports that she does not currently use alcohol after a past usage of about 3.0 standard drinks of alcohol per week. She reports that she does not use drugs.  Past Medical History:  Diagnosis Date   Anxiety    situational anxiety   GERD (gastroesophageal reflux disease)    with certain foods-OTC PRN meds   Hx of colonic polyp - sessile serrated polyp 04/06/2015   Hyperlipidemia    diet controlled-no meds   Osteopenia    Seasonal allergies    Spondylolisthesis    Vitamin D deficiency     Past Surgical History:  Procedure Laterality Date   BREAST EXCISIONAL BIOPSY Right 2002   BREAST LUMPECTOMY Right 2002   COLONOSCOPY  2016   CG-miralax (good)   GYNECOLOGIC CRYOSURGERY  1987   TUBAL LIGATION  1990   WISDOM TOOTH EXTRACTION      Current Outpatient Medications  Medication Sig Dispense Refill   buPROPion (WELLBUTRIN XL)  150 MG 24 hr tablet Take 1 tablet (150 mg total) by mouth daily. 90 tablet 1   Cholecalciferol 4000 units CAPS Take 1 capsule by mouth daily.     famotidine (PEPCID) 20 MG tablet Take 20 mg by mouth daily as needed.     fluticasone (FLONASE) 50 MCG/ACT nasal spray Place 2 sprays into both nostrils daily as needed.     meclizine (ANTIVERT) 25 MG tablet Take 1 tablet (25 mg total) by mouth 3 (three) times daily as needed for dizziness. 30 tablet 0   PSYLLIUM PO Take 1 packet by mouth daily. FIBER     temazepam (RESTORIL) 15 MG capsule TAKE 1-2 CAPSULES NIGHTLY AS NEEDED FOR SLEEP 45 capsule 0   No current facility-administered medications for this visit.    Family History  Problem Relation Age of Onset   Colon polyps Mother 70   Breast cancer Mother 43   Osteoporosis Mother    Colon cancer Mother 73   Other Mother        Dec from blood disorder   Diabetes Father        controlled by diet   Hypertension Father        ?   Heart disease Father        bypass surgery and pacemaker  Kidney disease Father    Diabetes Maternal Grandfather    Breast cancer Paternal Grandmother 39       had mastectomy in 109 or 1950 ? if secondary to cancer   Colon polyps Paternal Grandfather 69   Colon cancer Paternal Grandfather 79   Colon cancer Cousin        18s   Esophageal cancer Neg Hx    Rectal cancer Neg Hx    Stomach cancer Neg Hx     Review of Systems  All other systems reviewed and are negative.   Exam:   BP 139/67 (BP Location: Left Arm, Patient Position: Sitting, Cuff Size: Large)   Pulse 96   Ht 5' 2.25" (1.581 m) Comment: Reported  Wt 152 lb 9.6 oz (69.2 kg)   LMP 07/11/2011   BMI 27.69 kg/m   Height: 5' 2.25" (158.1 cm) (Reported)  General appearance: alert, cooperative and appears stated age Breasts: normal appearance, no masses or tenderness Abdomen: soft, non-tender; bowel sounds normal; no masses,  no organomegaly Lymph nodes: Cervical, supraclavicular, and axillary  nodes normal.  No abnormal inguinal nodes palpated Neurologic: Grossly normal  Pelvic: External genitalia:  no lesions              Urethra:  normal appearing urethra with no masses, tenderness or lesions              Bartholins and Skenes: normal                 Vagina: normal appearing vagina with atrophic changes and no discharge, no lesions              Cervix: no lesions              Pap taken: No. Bimanual Exam:  Uterus:  normal size, contour, position, consistency, mobility, non-tender              Adnexa: normal adnexa and no mass, fullness, tenderness               Rectovaginal: Confirms               Anus:  normal sphincter tone, no lesions  Chaperone, Octaviano Batty, CMA, was present for exam.  Assessment/Plan: 1. 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 Burnard Bunting - vaccines reviewed/updated  2. Primary insomnia - receives one prescription per year - temazepam (RESTORIL) 15 MG capsule; TAKE 1-2 CAPSULES NIGHTLY AS NEEDED FOR SLEEP  Dispense: 45 capsule; Refill: 0  3. Osteopenia, unspecified location  4. History of depression - buPROPion (WELLBUTRIN XL) 150 MG 24 hr tablet; Take 1 tablet (150 mg total) by mouth daily.  Dispense: 90 tablet; Refill: 3

## 2022-06-16 ENCOUNTER — Encounter (HOSPITAL_BASED_OUTPATIENT_CLINIC_OR_DEPARTMENT_OTHER): Payer: Self-pay

## 2022-06-16 ENCOUNTER — Ambulatory Visit (INDEPENDENT_AMBULATORY_CARE_PROVIDER_SITE_OTHER): Payer: Medicare PPO

## 2022-06-16 DIAGNOSIS — Z0000 Encounter for general adult medical examination without abnormal findings: Secondary | ICD-10-CM

## 2022-06-16 NOTE — Patient Instructions (Signed)

## 2022-06-16 NOTE — Progress Notes (Signed)
Subjective:   Grace Morales is a 67 y.o. female who presents for Medicare Annual (Subsequent) preventive examination.  I connected with  Randa Lynn on 06/16/22 by a audio enabled telemedicine application and verified that I am speaking with the correct person using two identifiers.  Patient Location: Home  Provider Location: Home Office  I discussed the limitations of evaluation and management by telemedicine. The patient expressed understanding and agreed to proceed.     Objective:    Today's Vitals   There is no height or weight on file to calculate BMI.     05/07/2019   10:38 AM 03/05/2015    2:25 PM  Advanced Directives  Does Patient Have a Medical Advance Directive? Yes Yes  Type of Corporate treasurer of [place];Living will  Does patient want to make changes to medical advance directive? No - Patient declined No - Patient declined  Copy of [place] in Chart?  No - copy requested    Current Medications (verified) Outpatient Encounter Medications as of 06/16/2022  Medication Sig   buPROPion (WELLBUTRIN XL) 150 MG 24 hr tablet Take 1 tablet (150 mg total) by mouth daily.   Cholecalciferol 4000 units CAPS Take 1 capsule by mouth daily.   famotidine (PEPCID) 20 MG tablet Take 20 mg by mouth daily as needed.   fluticasone (FLONASE) 50 MCG/ACT nasal spray Place 2 sprays into both nostrils daily as needed.   meclizine (ANTIVERT) 25 MG tablet Take 1 tablet (25 mg total) by mouth 3 (three) times daily as needed for dizziness.   PSYLLIUM PO Take 1 packet by mouth daily. FIBER   temazepam (RESTORIL) 15 MG capsule TAKE 1-2 CAPSULES NIGHTLY AS NEEDED FOR SLEEP   No facility-administered encounter medications on file as of 06/16/2022.    Allergies (verified) Penicillins and Sulfa antibiotics   History: Past Medical History:  Diagnosis Date   Anxiety    situational anxiety   GERD (gastroesophageal reflux disease)     with certain foods-OTC PRN meds   Hx of colonic polyp - sessile serrated polyp 04/06/2015   Hyperlipidemia    diet controlled-no meds   Osteopenia    Seasonal allergies    Spondylolisthesis    Vitamin D deficiency    Past Surgical History:  Procedure Laterality Date   BREAST EXCISIONAL BIOPSY Right 2002   BREAST LUMPECTOMY Right 2002   COLONOSCOPY  2016   CG-miralax (good)   GYNECOLOGIC CRYOSURGERY  1987   TUBAL LIGATION  1990   WISDOM TOOTH EXTRACTION     Family History  Problem Relation Age of Onset   Colon polyps Mother 36   Breast cancer Mother 35   Osteoporosis Mother    Colon cancer Mother 62   Other Mother        Dec from blood disorder   Diabetes Father        controlled by diet   Hypertension Father        ?   Heart disease Father        bypass surgery and pacemaker   Kidney disease Father    Diabetes Maternal Grandfather    Breast cancer Paternal Grandmother 67       had mastectomy in 1940 or 1950 ? if secondary to cancer   Colon polyps Paternal Grandfather 28   Colon cancer Paternal Grandfather 76   Colon cancer Cousin        92s   Esophageal cancer Neg Hx  Rectal cancer Neg Hx    Stomach cancer Neg Hx    Social History   Socioeconomic History   Marital status: Married    Spouse name: Not on file   Number of children: Not on file   Years of education: Not on file   Highest education level: Not on file  Occupational History   Not on file  Tobacco Use   Smoking status: Never   Smokeless tobacco: Never  Vaping Use   Vaping Use: Never used  Substance and Sexual Activity   Alcohol use: Not Currently    Alcohol/week: 3.0 standard drinks of alcohol    Types: 3 Glasses of wine per week   Drug use: No   Sexual activity: Yes    Partners: Male    Birth control/protection: Post-menopausal, Surgical    Comment: BTL  Other Topics Concern   Not on file  Social History Narrative   Not on file   Social Determinants of Health   Financial Resource  Strain: Not on file  Food Insecurity: No Food Insecurity (06/16/2022)   Hunger Vital Sign    Worried About Running Out of Food in the Last Year: Never true    Ran Out of Food in the Last Year: Never true  Transportation Needs: Not on file  Physical Activity: Not on file  Stress: Not on file  Social Connections: Not on file    Tobacco Counseling Counseling given: Yes   Clinical Intake:  Pre-visit preparation completed: Yes  Pain : No/denies pain     Diabetes: No  How often do you need to have someone help you when you read instructions, pamphlets, or other written materials from your doctor or pharmacy?: 1 - Never What is the last grade level you completed in school?: 4 year college degree  Diabetic?no  Interpreter Needed?: No      Activities of Daily Living    06/16/2022   11:58 AM 06/13/2022    7:37 AM  In your present state of health, do you have any difficulty performing the following activities:  Hearing? 0 0  Vision? 0 0  Difficulty concentrating or making decisions? 0 0  Walking or climbing stairs? 0 0  Dressing or bathing? 0 0  Doing errands, shopping? 0 0  Preparing Food and eating ?  N  Using the Toilet?  N  In the past six months, have you accidently leaked urine?  N  Do you have problems with loss of bowel control?  N  Managing your Medications?  N  Managing your Finances?  N  Housekeeping or managing your Housekeeping?  N    Patient Care Team: [place], Blondell Reveal, MD as PCP - General (Family Medicine)  Indicate any recent Medical Services you may have received from other than [facility] providers in the past year (date may be approximate).     Assessment:   This is a routine wellness examination for [place].  Hearing/Vision screen No results found.  Dietary issues and exercise activities discussed:     Goals Addressed   None   Depression Screen    05/25/2022    3:46 PM 10/24/2021    2:06 PM 04/04/2021    9:56 AM 09/06/2020    9:57 AM  05/07/2019   10:35 AM  PHQ 2/9 Scores  PHQ - 2 Score 0 0 0  0  PHQ- 9 Score  1     Exception Documentation    Patient refusal     Fall Risk  06/16/2022   11:58 AM 06/13/2022    7:37 AM 10/24/2021    2:05 PM 05/07/2019   10:35 AM  Fall Risk   Falls in the past year? 0 0 0 0  Number falls in past yr:   0   Injury with Fall?  0 0   Risk for fall due to :   No Fall Risks   Follow up   Falls evaluation completed     [place]:  Any stairs in or around the home? Yes  If so, are there any without handrails? No  Home free of loose throw rugs in walkways, pet beds, electrical cords, etc? Yes  Adequate lighting in your home to reduce risk of falls? Yes   ASSISTIVE DEVICES UTILIZED TO PREVENT FALLS:  Life alert? No  Use of a cane, walker or w/c? No  Grab bars in the bathroom? No  Shower chair or bench in shower? No  Elevated toilet seat or a handicapped toilet? No    Cognitive Function:        06/16/2022   11:59 AM  6CIT Screen  What Year? 0 points  What month? 0 points  What time? 0 points  Count back from 20 0 points  Months in reverse 0 points  Repeat phrase 0 points  Total Score 0 points    Immunizations Immunization History  Administered Date(s) Administered   Fluad Quad(high Dose 65+) 04/03/2021   Influenza, Quadrivalent, Recombinant, Inj, Pf 04/05/2020   Influenza,inj,Quad PF,6+ Mos 02/27/2016   Influenza,inj,quad, With Preservative 02/27/2016   Influenza-Unspecified 03/14/2022   Moderna Covid-19 Vaccine Bivalent Booster 72yr & up 03/10/2022   PFIZER(Purple Top)SARS-COV-2 Vaccination 09/23/2019, 04/12/2020, 08/29/2020, 01/26/2021   PNEUMOCOCCAL CONJUGATE-20 05/26/2021   Pneumococcal Polysaccharide-23 07/05/2019   Rabies, IM 09/25/2012, 09/28/2012, 10/02/2012, 10/09/2012   Rabies, intradermal 09/25/2012   Tdap 11/03/2016   Zoster Recombinat (Shingrix) 04/23/2018, 08/30/2018    TDAP status: Up to date  Flu Vaccine  status: Up to date  Pneumococcal vaccine status: Up to date  Covid-19 vaccine status: Completed vaccines  Qualifies for Shingles Vaccine? Yes   Zostavax completed Yes   Shingrix Completed?: Yes  Screening Tests Health Maintenance  Topic Date Due   COVID-19 Vaccine (6 - 2023-24 season) 05/05/2022   Medicare Annual Wellness (AWV)  06/17/2023   MAMMOGRAM  10/27/2023   COLONOSCOPY (Pts 45-446yrInsurance coverage will need to be confirmed)  06/27/2026   DTaP/Tdap/Td (2 - Td or Tdap) 11/04/2026   Pneumonia Vaccine 6568Years old  Completed   INFLUENZA VACCINE  Completed   DEXA SCAN  Completed   Hepatitis C Screening  Completed   Zoster Vaccines- Shingrix  Completed   HPV VACCINES  Aged Out    Health Maintenance  Health Maintenance Due  Topic Date Due   COVID-19 Vaccine (6 - 2023-24 season) 05/05/2022    Colorectal cancer screening: Type of screening: Colonoscopy. Completed 06/27/2021. Repeat every 5 years  Mammogram status: Completed 4/19/20023. Repeat every year  Bone Density status: Completed 10/26/2021. Results reflect: Bone density results: OSTEOPENIA. Repeat every 2 years.  Lung Cancer Screening: (Low Dose CT Chest recommended if Age 66-80ears, 30 pack-year currently smoking OR have quit w/in 15years.) does not qualify.   Lung Cancer Screening Referral: n/a  Additional Screening:  Hepatitis C Screening: does qualify; Completed 11/03/2016  Vision Screening: Recommended annual ophthalmology exams for early detection of glaucoma and other disorders of the eye. Is the patient up to date with  their annual eye exam?  Yes  Who is the provider or what is the name of the office in which the patient attends annual eye exams? Dr Herbert Deaner  If pt is not established with a provider, would they like to be referred to a provider to establish care? No .   Dental Screening: Recommended annual dental exams for proper oral hygiene  Community Resource Referral / Chronic Care  Management: CRR required this visit?  No   CCM required this visit?  No      Plan:     I have personally reviewed and noted the following in the patient's chart:   Medical and social history Use of alcohol, tobacco or illicit drugs  Current medications and supplements including opioid prescriptions. Patient is not currently taking opioid prescriptions. Functional ability and status Nutritional status Physical activity Advanced directives List of other physicians Hospitalizations, surgeries, and ER visits in previous 12 months Vitals Screenings to include cognitive, depression, and falls Referrals and appointments  In addition, I have reviewed and discussed with patient certain preventive protocols, quality metrics, and best practice recommendations. A written personalized care plan for preventive services as well as general preventive health recommendations were provided to patient.     Debbora Dus, [place]   06/16/2022   Nurse Notes: provider notified. Patient completion portions via [place].

## 2022-07-11 ENCOUNTER — Encounter (HOSPITAL_BASED_OUTPATIENT_CLINIC_OR_DEPARTMENT_OTHER): Payer: Self-pay | Admitting: Obstetrics & Gynecology

## 2022-07-11 ENCOUNTER — Other Ambulatory Visit (HOSPITAL_COMMUNITY)
Admission: RE | Admit: 2022-07-11 | Discharge: 2022-07-11 | Disposition: A | Discharge status: Home / Self Care | Payer: Medicare PPO | Source: Ambulatory Visit | Attending: Obstetrics & Gynecology | Admitting: Obstetrics & Gynecology

## 2022-07-11 ENCOUNTER — Ambulatory Visit (HOSPITAL_BASED_OUTPATIENT_CLINIC_OR_DEPARTMENT_OTHER): Payer: Medicare PPO | Admitting: Obstetrics & Gynecology

## 2022-07-11 VITALS — BP 129/80 | HR 93 | Ht 62.0 in | Wt 153.8 lb

## 2022-07-11 DIAGNOSIS — K629 Disease of anus and rectum, unspecified: Secondary | ICD-10-CM

## 2022-07-11 DIAGNOSIS — K620 Anal polyp: Secondary | ICD-10-CM

## 2022-07-11 NOTE — Progress Notes (Unsigned)
GYNECOLOGY  VISIT  CC:   removal of perianal lesion  HPI: 67 y.o. G0P0 Married White or Caucasian female here for removal of a peri-anal lesion.  The finding is long and dangles and is not ulcerated.  Denies pain.  The lesion is about 1cm.  Consent obtained.   Past Medical History:  Diagnosis Date   Anxiety    situational anxiety   GERD (gastroesophageal reflux disease)    with certain foods-OTC PRN meds   Hx of colonic polyp - sessile serrated polyp 04/06/2015   Hyperlipidemia    diet controlled-no meds   Osteopenia    Seasonal allergies    Spondylolisthesis    Vitamin D deficiency     MEDS:   Current Outpatient Medications on File Prior to Visit  Medication Sig Dispense Refill   buPROPion (WELLBUTRIN XL) 150 MG 24 hr tablet Take 1 tablet (150 mg total) by mouth daily. 90 tablet 3   Cholecalciferol 4000 units CAPS Take 1 capsule by mouth daily.     famotidine (PEPCID) 20 MG tablet Take 20 mg by mouth daily as needed.     fluticasone (FLONASE) 50 MCG/ACT nasal spray Place 2 sprays into both nostrils daily as needed.     meclizine (ANTIVERT) 25 MG tablet Take 1 tablet (25 mg total) by mouth 3 (three) times daily as needed for dizziness. 30 tablet 0   PSYLLIUM PO Take 1 packet by mouth daily. FIBER     temazepam (RESTORIL) 15 MG capsule TAKE 1-2 CAPSULES NIGHTLY AS NEEDED FOR SLEEP 45 capsule 0   No current facility-administered medications on file prior to visit.    ALLERGIES: Penicillins and Sulfa antibiotics  SH:  married, non smoker  Review of Systems  Constitutional: Negative.     PHYSICAL EXAMINATION:    BP 129/80 (BP Location: Right Arm, Patient Position: Sitting, Cuff Size: Large)   Pulse 93   Ht '5\' 2"'$  (1.575 m) Comment: Reported  Wt 153 lb 12.8 oz (69.8 kg)   LMP 07/11/2011   BMI 28.13 kg/m     General appearance: alert, cooperative and appears stated age Lymph:  no inguinal LAD noted  Pelvic: External genitalia:  no lesions             Anus:  about  1.0 cm lesion arising at 6 o'clock on the anus noted.  Consent obtained.  Area cleansed with Betadine x 3.  0.5cc 1% Lidocaine instilled.  Lesion grasped and excised at the base.  Silver nitrate used for excellent hemostasis.  Pt tolerated procedure well.  Lesion sent to pathology   Assessment/Plan: 1. Anal lesion - post procedure instructions reviewed - Surgical pathology( [place] [facility]/ [facility]) - pathology results will be communicated to pt and any additional follow up needed will be as well.

## 2022-07-12 LAB — SURGICAL PATHOLOGY

## 2022-07-27 DIAGNOSIS — M545 Low back pain, unspecified: Secondary | ICD-10-CM | POA: Diagnosis not present

## 2022-07-27 DIAGNOSIS — M4316 Spondylolisthesis, lumbar region: Secondary | ICD-10-CM | POA: Diagnosis not present

## 2022-07-27 DIAGNOSIS — M47896 Other spondylosis, lumbar region: Secondary | ICD-10-CM | POA: Diagnosis not present

## 2022-08-08 DIAGNOSIS — M5416 Radiculopathy, lumbar region: Secondary | ICD-10-CM | POA: Diagnosis not present

## 2022-08-24 DIAGNOSIS — M5416 Radiculopathy, lumbar region: Secondary | ICD-10-CM | POA: Diagnosis not present

## 2022-08-24 DIAGNOSIS — M4316 Spondylolisthesis, lumbar region: Secondary | ICD-10-CM | POA: Diagnosis not present

## 2022-09-07 DIAGNOSIS — M5416 Radiculopathy, lumbar region: Secondary | ICD-10-CM | POA: Diagnosis not present

## 2022-09-12 ENCOUNTER — Other Ambulatory Visit: Payer: Self-pay | Admitting: Obstetrics & Gynecology

## 2022-09-12 DIAGNOSIS — Z1231 Encounter for screening mammogram for malignant neoplasm of breast: Secondary | ICD-10-CM

## 2022-09-15 DIAGNOSIS — M47896 Other spondylosis, lumbar region: Secondary | ICD-10-CM | POA: Diagnosis not present

## 2022-09-15 DIAGNOSIS — M4316 Spondylolisthesis, lumbar region: Secondary | ICD-10-CM | POA: Diagnosis not present

## 2022-10-12 DIAGNOSIS — H04123 Dry eye syndrome of bilateral lacrimal glands: Secondary | ICD-10-CM | POA: Diagnosis not present

## 2022-10-12 DIAGNOSIS — H47321 Drusen of optic disc, right eye: Secondary | ICD-10-CM | POA: Diagnosis not present

## 2022-10-12 DIAGNOSIS — H11151 Pinguecula, right eye: Secondary | ICD-10-CM | POA: Diagnosis not present

## 2022-10-12 DIAGNOSIS — H25813 Combined forms of age-related cataract, bilateral: Secondary | ICD-10-CM | POA: Diagnosis not present

## 2022-10-16 ENCOUNTER — Other Ambulatory Visit (HOSPITAL_BASED_OUTPATIENT_CLINIC_OR_DEPARTMENT_OTHER): Payer: Self-pay

## 2022-10-16 ENCOUNTER — Ambulatory Visit (HOSPITAL_BASED_OUTPATIENT_CLINIC_OR_DEPARTMENT_OTHER): Payer: Medicare PPO

## 2022-10-16 DIAGNOSIS — Z0000 Encounter for general adult medical examination without abnormal findings: Secondary | ICD-10-CM

## 2022-10-17 LAB — CBC WITH DIFFERENTIAL/PLATELET
Basophils Absolute: 0.1 10*3/uL (ref 0.0–0.2)
Basos: 1 %
EOS (ABSOLUTE): 0.1 10*3/uL (ref 0.0–0.4)
Eos: 3 %
Hematocrit: 39.9 % (ref 34.0–46.6)
Hemoglobin: 13.2 g/dL (ref 11.1–15.9)
Immature Grans (Abs): 0 10*3/uL (ref 0.0–0.1)
Immature Granulocytes: 0 %
Lymphocytes Absolute: 1.8 10*3/uL (ref 0.7–3.1)
Lymphs: 38 %
MCH: 29.5 pg (ref 26.6–33.0)
MCHC: 33.1 g/dL (ref 31.5–35.7)
MCV: 89 fL (ref 79–97)
Monocytes Absolute: 0.3 10*3/uL (ref 0.1–0.9)
Monocytes: 6 %
Neutrophils Absolute: 2.4 10*3/uL (ref 1.4–7.0)
Neutrophils: 52 %
Platelets: 275 10*3/uL (ref 150–450)
RBC: 4.48 x10E6/uL (ref 3.77–5.28)
RDW: 13.1 % (ref 11.7–15.4)
WBC: 4.7 10*3/uL (ref 3.4–10.8)

## 2022-10-17 LAB — TSH: TSH: 3.03 u[IU]/mL (ref 0.450–4.500)

## 2022-10-17 LAB — LIPID PANEL
Chol/HDL Ratio: 3.4 ratio (ref 0.0–4.4)
Cholesterol, Total: 236 mg/dL — ABNORMAL HIGH (ref 100–199)
HDL: 69 mg/dL (ref >39)
LDL Chol Calc (NIH): 155 mg/dL — ABNORMAL HIGH (ref 0–99)
Triglycerides: 72 mg/dL (ref 0–149)
VLDL Cholesterol Cal: 12 mg/dL (ref 5–40)

## 2022-10-17 LAB — VITAMIN B12: Vitamin B-12: 486 pg/mL (ref 232–1245)

## 2022-10-17 LAB — COMPREHENSIVE METABOLIC PANEL
ALT: 16 IU/L (ref 0–32)
AST: 19 IU/L (ref 0–40)
Albumin/Globulin Ratio: 1.7 (ref 1.2–2.2)
Albumin: 4.3 g/dL (ref 3.9–4.9)
Alkaline Phosphatase: 64 IU/L (ref 44–121)
BUN/Creatinine Ratio: 22 (ref 12–28)
BUN: 21 mg/dL (ref 8–27)
Bilirubin Total: 0.3 mg/dL (ref 0.0–1.2)
CO2: 22 mmol/L (ref 20–29)
Calcium: 9.8 mg/dL (ref 8.7–10.3)
Chloride: 103 mmol/L (ref 96–106)
Creatinine, Ser: 0.96 mg/dL (ref 0.57–1.00)
Globulin, Total: 2.5 g/dL (ref 1.5–4.5)
Glucose: 89 mg/dL (ref 70–99)
Potassium: 4.8 mmol/L (ref 3.5–5.2)
Sodium: 140 mmol/L (ref 134–144)
Total Protein: 6.8 g/dL (ref 6.0–8.5)
eGFR: 65 mL/min/{1.73_m2} (ref >59)

## 2022-10-17 LAB — HEMOGLOBIN A1C
Est. average glucose Bld gHb Est-mCnc: 120 mg/dL
Hgb A1c MFr Bld: 5.8 % — ABNORMAL HIGH (ref 4.8–5.6)

## 2022-10-17 LAB — VITAMIN D 25 HYDROXY (VIT D DEFICIENCY, FRACTURES): Vit D, 25-Hydroxy: 55.3 ng/mL (ref 30.0–100.0)

## 2022-10-26 ENCOUNTER — Encounter (HOSPITAL_BASED_OUTPATIENT_CLINIC_OR_DEPARTMENT_OTHER): Payer: Medicare PPO | Admitting: Family Medicine

## 2022-10-26 ENCOUNTER — Encounter (HOSPITAL_BASED_OUTPATIENT_CLINIC_OR_DEPARTMENT_OTHER): Payer: Self-pay | Admitting: Family Medicine

## 2022-10-26 ENCOUNTER — Ambulatory Visit (INDEPENDENT_AMBULATORY_CARE_PROVIDER_SITE_OTHER): Payer: Medicare PPO | Admitting: Family Medicine

## 2022-10-26 VITALS — BP 149/86 | HR 97 | Temp 97.6°F | Ht 62.0 in | Wt 152.0 lb

## 2022-10-26 DIAGNOSIS — R030 Elevated blood-pressure reading, without diagnosis of hypertension: Secondary | ICD-10-CM | POA: Diagnosis not present

## 2022-10-26 DIAGNOSIS — Z0000 Encounter for general adult medical examination without abnormal findings: Secondary | ICD-10-CM | POA: Diagnosis not present

## 2022-10-26 NOTE — Progress Notes (Signed)
Complete physical exam  Patient: Grace Morales   DOB: Sep 08, 1955   67 y.o. Female  MRN: [id number]  Subjective:   Grace Morales is a 67 y.o. female who presents today for a complete physical exam. She reports consuming a general diet. Gym/ health club routine includes sees a trainer twice a week. And walks when she can- gets in 150 minutes weekly. She generally feels well. She reports sleeping poorly. She has temazepam to help her sleep at night but uses it sparingly. She does not have additional problems to discuss today.   BP readings at home are "normal." She does not remember the numbers, but she checks it occasionally and says they are not high.     Depression screenings:    10/26/2022    1:48 PM 07/11/2022    9:23 AM 05/25/2022    3:46 PM  Depression screen PHQ 2/9  Decreased Interest 0 0 0  Down, Depressed, Hopeless 0 0 0  PHQ - 2 Score 0 0 0    Anxiety screenings:    10/26/2022    1:48 PM  GAD 7 : Generalized Anxiety Score  Nervous, Anxious, on Edge 0  Control/stop worrying 0  Worry too much - different things 0  Trouble relaxing 0  Restless 0  Easily annoyed or irritable 0  Afraid - awful might happen 0  Total GAD 7 Score 0  Anxiety Difficulty Not difficult at all   Vision- annually  Dentist- sees Q6 months   Patient Care Team: [place], Buren Kos, MD as PCP - General (Family Medicine)   Outpatient Medications Prior to Visit  Medication Sig   buPROPion (WELLBUTRIN XL) 150 MG 24 hr tablet Take 1 tablet (150 mg total) by mouth daily.   Cholecalciferol 4000 units CAPS Take 1 capsule by mouth daily.   famotidine (PEPCID) 20 MG tablet Take 20 mg by mouth daily as needed.   fluticasone (FLONASE) 50 MCG/ACT nasal spray Place 2 sprays into both nostrils daily as needed.   meclizine (ANTIVERT) 25 MG tablet Take 1 tablet (25 mg total) by mouth 3 (three) times daily as needed for dizziness.   PSYLLIUM PO Take 1 packet by mouth daily. FIBER   temazepam  (RESTORIL) 15 MG capsule TAKE 1-2 CAPSULES NIGHTLY AS NEEDED FOR SLEEP   No facility-administered medications prior to visit.    Review of Systems  Constitutional:  Negative for malaise/fatigue.  HENT:  Negative for ear pain and tinnitus.   Eyes:  Negative for blurred vision and double vision.  Respiratory:  Negative for cough and shortness of breath.   Cardiovascular:  Negative for chest pain.  Gastrointestinal:  Negative for abdominal pain, nausea and vomiting.  Genitourinary:  Negative for frequency and urgency.  Musculoskeletal:  Negative for myalgias.  Neurological:  Negative for dizziness, weakness and headaches.  Psychiatric/Behavioral:  Negative for depression and suicidal ideas. The patient is not nervous/anxious.    The 10-year ASCVD risk score (Arnett DK, et al., 2019) is: 8.2%   Values used to calculate the score:     Age: 73 years     Sex: Female     Is Non-Hispanic African American: No     Diabetic: No     Tobacco smoker: No     Systolic Blood Pressure: 149 mmHg     Is BP treated: No     HDL Cholesterol: 69 mg/dL     Total Cholesterol: 236 mg/dL    Objective:    BP Marland Kitchen)  149/86 (BP Location: Left Arm, Patient Position: Sitting, Cuff Size: Normal)   Pulse 97   Temp 97.6 F (36.4 C) (Oral)   Ht  (1.575 m)   Wt 152 lb (68.9 kg)   LMP 07/11/2011   SpO2 100%   BMI 27.80 kg/m  BP Readings from Last 3 Encounters:  10/26/22 (!) 149/86  07/11/22 129/80  05/25/22 139/67    Physical Exam Constitutional:      Appearance: Normal appearance.  HENT:     Head: Normocephalic.     Right Ear: Tympanic membrane, ear canal and external ear normal.     Left Ear: Tympanic membrane, ear canal and external ear normal.     Nose: Nose normal.     Mouth/Throat:     Mouth: Mucous membranes are moist.     Pharynx: Oropharynx is clear.  Eyes:     Extraocular Movements: Extraocular movements intact.     Pupils: Pupils are equal, round, and reactive to light.   Cardiovascular:     Rate and Rhythm: Normal rate and regular rhythm.     Pulses: Normal pulses.     Heart sounds: Normal heart sounds.  Pulmonary:     Effort: Pulmonary effort is normal.     Breath sounds: Normal breath sounds.  Abdominal:     General: Abdomen is flat. Bowel sounds are normal.     Palpations: Abdomen is soft.  Musculoskeletal:        General: Normal range of motion.     Cervical back: Normal range of motion.  Skin:    General: Skin is warm and dry.  Neurological:     Mental Status: She is alert.  Psychiatric:        Mood and Affect: Mood normal.        Behavior: Behavior normal.        Thought Content: Thought content normal.        Judgment: Judgment normal.     Assessment & Plan:    Routine Health Maintenance and Physical Exam  Health Maintenance  Topic Date Due   COVID-19 Vaccine (6 - 2023-24 season) 05/05/2022   Flu Shot  02/08/2023   Medicare Annual Wellness Visit  06/17/2023   Mammogram  10/27/2023   Colon Cancer Screening  06/27/2026   DTaP/Tdap/Td vaccine (2 - Td or Tdap) 11/04/2026   Pneumonia Vaccine  Completed   DEXA scan (bone density measurement)  Completed   Hepatitis C Screening: USPSTF Recommendation to screen - Ages 94-79 yo.  Completed   Zoster (Shingles) Vaccine  Completed   HPV Vaccine  Aged Out   1. Wellness examination Routine HCM labs ordered. Labs reviewed/discussed today.  Review of PMH, FH, SH, medications and HM performed.  Recommend healthy diet.  Recommend approximately 150 minutes/week of moderate intensity exercise. Recommend regular dental and vision exams. Always use seatbelt/lap and shoulder restraints. Recommend using smoke alarms and checking batteries at least twice a year. Recommend using sunscreen when outside. Discussed immunization recommendations. Vaccines are UTD. Discussed immunization recommendations for shingles/pneumonia/tetanus vaccine. Patient agreed to proceed with this today/patient declines at  this time, will consider.   Discussed colon cancer screening recommendations. UTD, due 2027. DEXA scan completed in 10/2021- showing osteopenia.  Breast cancer screening recommendations reviewed. She is scheduled for next week. Pap smear UTD. Done 04/04/2021 negative.    2. Elevated blood pressure reading in office without diagnosis of hypertension Reviewed elevated blood pressure reading in office. Advised patient to seek emergency services if  she develops chest pain, palpitations, changes in vision, shortness of breath, lower extremity edema, severe headache. Advised patient continue to monitor at home and return to office if it remains >140/>90.    Return in about 1 year (around 10/26/2023) for Physical with fasting labs.     Alyson Reedy, FNP

## 2022-10-30 ENCOUNTER — Ambulatory Visit
Admission: RE | Admit: 2022-10-30 | Discharge: 2022-10-30 | Disposition: A | Discharge status: Home / Self Care | Payer: Medicare PPO | Source: Ambulatory Visit | Attending: Obstetrics & Gynecology | Admitting: Obstetrics & Gynecology

## 2022-10-30 DIAGNOSIS — Z1231 Encounter for screening mammogram for malignant neoplasm of breast: Secondary | ICD-10-CM

## 2022-11-11 ENCOUNTER — Other Ambulatory Visit (HOSPITAL_BASED_OUTPATIENT_CLINIC_OR_DEPARTMENT_OTHER): Payer: Self-pay | Admitting: Obstetrics & Gynecology

## 2022-11-11 DIAGNOSIS — F5101 Primary insomnia: Secondary | ICD-10-CM

## 2023-01-31 DIAGNOSIS — L814 Other melanin hyperpigmentation: Secondary | ICD-10-CM | POA: Diagnosis not present

## 2023-01-31 DIAGNOSIS — L570 Actinic keratosis: Secondary | ICD-10-CM | POA: Diagnosis not present

## 2023-01-31 DIAGNOSIS — Z85828 Personal history of other malignant neoplasm of skin: Secondary | ICD-10-CM | POA: Diagnosis not present

## 2023-04-13 DIAGNOSIS — J018 Other acute sinusitis: Secondary | ICD-10-CM | POA: Diagnosis not present

## 2023-04-13 DIAGNOSIS — R030 Elevated blood-pressure reading, without diagnosis of hypertension: Secondary | ICD-10-CM | POA: Diagnosis not present

## 2023-04-13 DIAGNOSIS — Z6827 Body mass index (BMI) 27.0-27.9, adult: Secondary | ICD-10-CM | POA: Diagnosis not present

## 2023-05-15 ENCOUNTER — Ambulatory Visit (HOSPITAL_BASED_OUTPATIENT_CLINIC_OR_DEPARTMENT_OTHER): Payer: Medicare PPO

## 2023-05-15 ENCOUNTER — Encounter (HOSPITAL_BASED_OUTPATIENT_CLINIC_OR_DEPARTMENT_OTHER): Payer: Self-pay

## 2023-05-15 DIAGNOSIS — Z0000 Encounter for general adult medical examination without abnormal findings: Secondary | ICD-10-CM | POA: Diagnosis not present

## 2023-05-15 NOTE — Progress Notes (Signed)
Subjective:   Grace Morales is a 67 y.o. female who presents for Medicare Annual (Subsequent) preventive examination.  Visit Complete: Virtual I connected with  Clotilde Dieter on 05/15/23 by a audio enabled telemedicine application and verified that I am speaking with the correct person using two identifiers.  Patient Location: Home  Provider Location: Home Office  I discussed the limitations of evaluation and management by telemedicine. The patient expressed understanding and agreed to proceed.  Vital Signs: Because this visit was a virtual/telehealth visit, some criteria may be missing or patient reported. Any vitals not documented were not able to be obtained and vitals that have been documented are patient reported.      Objective:    There were no vitals filed for this visit. There is no height or weight on file to calculate BMI.     05/15/2023    1:59 PM 05/07/2019   10:38 AM 03/05/2015    2:25 PM  Advanced Directives  Does Patient Have a Medical Advance Directive? Yes Yes Yes  Type of Estate agent of [employer] Power of [place];Living will  Does patient want to make changes to medical advance directive?  No - Patient declined No - Patient declined  Copy of Healthcare Power of Attorney in Chart? No - copy requested  No - copy requested    Current Medications (verified) Outpatient Encounter Medications as of 05/15/2023  Medication Sig   buPROPion (WELLBUTRIN XL) 150 MG 24 hr tablet Take 1 tablet (150 mg total) by mouth daily.   Cholecalciferol 4000 units CAPS Take 1 capsule by mouth daily.   famotidine (PEPCID) 20 MG tablet Take 20 mg by mouth daily as needed.   fluticasone (FLONASE) 50 MCG/ACT nasal spray Place 2 sprays into both nostrils daily as needed.   meclizine (ANTIVERT) 25 MG tablet Take 1 tablet (25 mg total) by mouth 3 (three) times daily as needed for dizziness.   PSYLLIUM PO Take 1 packet by mouth daily. FIBER    temazepam (RESTORIL) 15 MG capsule TAKE 1-2 CAPSULES NIGHTLY AS NEEDED FOR SLEEP. *INS COVERS 1.3 CAPS/DAY*   No facility-administered encounter medications on file as of 05/15/2023.    Allergies (verified) Penicillins and Sulfa antibiotics   History: Past Medical History:  Diagnosis Date   Anxiety    situational anxiety   GERD (gastroesophageal reflux disease)    with certain foods-OTC PRN meds   Hx of colonic polyp - sessile serrated polyp 04/06/2015   Hyperlipidemia    diet controlled-no meds   Osteopenia    Seasonal allergies    Spondylolisthesis    Vitamin D deficiency    Past Surgical History:  Procedure Laterality Date   BREAST EXCISIONAL BIOPSY Right 2002   BREAST LUMPECTOMY Right 2002   Benign excision   COLONOSCOPY  2016   CG-miralax (good)   GYNECOLOGIC CRYOSURGERY  1987   TUBAL LIGATION  1990   WISDOM TOOTH EXTRACTION     Family History  Problem Relation Age of Onset   Colon polyps Mother 4   Breast cancer Mother 32   Osteoporosis Mother    Colon cancer Mother 80   Other Mother        Dec from blood disorder   Diabetes Father        controlled by diet   Hypertension Father        ?   Heart disease Father        bypass surgery and pacemaker  Kidney disease Father    Diabetes Maternal Grandfather    Breast cancer Paternal Grandmother 3       had mastectomy in 63 or 1950 ? if secondary to cancer   Colon polyps Paternal Grandfather 46   Colon cancer Paternal Grandfather 45   Colon cancer Cousin        30s   Esophageal cancer Neg Hx    Rectal cancer Neg Hx    Stomach cancer Neg Hx    Social History   Socioeconomic History   Marital status: Married    Spouse name: Not on file   Number of children: Not on file   Years of education: Not on file   Highest education level: Not on file  Occupational History   Not on file  Tobacco Use   Smoking status: Never   Smokeless tobacco: Never  Vaping Use   Vaping status: Never Used  Substance  and Sexual Activity   Alcohol use: Not Currently    Alcohol/week: 3.0 standard drinks of alcohol    Types: 3 Glasses of wine per week   Drug use: No   Sexual activity: Yes    Partners: Male    Birth control/protection: Post-menopausal, Surgical    Comment: BTL  Other Topics Concern   Not on file  Social History Narrative   Not on file   Social Determinants of Health   Financial Resource Strain: Not on file  Food Insecurity: No Food Insecurity (05/15/2023)   Hunger Vital Sign    Worried About Running Out of Food in the Last Year: Never true    Ran Out of Food in the Last Year: Never true  Transportation Needs: No Transportation Needs (05/15/2023)   PRAPARE - Administrator, Civil Service (Medical): No    Lack of Transportation (Non-Medical): No  Physical Activity: Sufficiently Active (05/15/2023)   Exercise Vital Sign    Days of Exercise per Week: 4 days    Minutes of Exercise per Session: 60 min  Stress: Not on file  Social Connections: Socially Integrated (05/15/2023)   Social Connection and Isolation Panel [NHANES]    Frequency of Communication with Friends and Family: More than three times a week    Frequency of Social Gatherings with Friends and Family: More than three times a week    Attends Religious Services: More than 4 times per year    Active Member of [employer] or Organizations: Yes    Attends Engineer, structural: More than 4 times per year    Marital Status: Married    Tobacco Counseling Counseling given: Not Answered   Clinical Intake:  Pre-visit preparation completed: Yes  Pain : No/denies pain     Diabetes: No  How often do you need to have someone help you when you read instructions, pamphlets, or other written materials from your doctor or pharmacy?: 1 - Never  Interpreter Needed?: No  Information entered by :: Remi Haggard LPN   Activities of Daily Living    05/15/2023    2:03 PM 10/26/2022    1:48 PM  In your present state  of health, do you have any difficulty performing the following activities:  Hearing? 0 0  Vision? 0 0  Difficulty concentrating or making decisions? 0 0  Walking or climbing stairs? 0 0  Dressing or bathing? 0 0  Doing errands, shopping? 0 0  Preparing Food and eating ? N   Using the Toilet? N   In the past  six months, have you accidently leaked urine? N   Do you have problems with loss of bowel control? N   Managing your Medications? N   Managing your Finances? N   Housekeeping or managing your Housekeeping? N     Patient Care Team: [place], Buren Kos, MD as PCP - General (Family Medicine)  Indicate any recent Medical Services you may have received from other than [facility] providers in the past year (date may be approximate).     Assessment:   This is a routine wellness examination for [place].  Hearing/Vision screen Hearing Screening - Comments:: No trouble hearing Vision Screening - Comments:: Up to date Vickey Huger    Goals Addressed             This Visit's Progress    Weight (lb) < 200 lb (90.7 kg)         Depression Screen    05/15/2023    2:03 PM 10/26/2022    1:48 PM 07/11/2022    9:23 AM 05/25/2022    3:46 PM 10/24/2021    2:06 PM 04/04/2021    9:56 AM 09/06/2020    9:57 AM  PHQ 2/9 Scores  PHQ - 2 Score 0 0 0 0 0 0   PHQ- 9 Score 0    1    Exception Documentation  Medical reason     Patient refusal    Fall Risk    05/15/2023    2:01 PM 10/26/2022    1:48 PM 06/16/2022   11:58 AM 06/13/2022    7:37 AM 10/24/2021    2:05 PM  Fall Risk   Falls in the past year? 0 0 0 0 0  Number falls in past yr: 0 0   0  Injury with Fall? 0 0  0 0  Risk for fall due to :  No Fall Risks   No Fall Risks  Follow up Falls evaluation completed;Education provided;Falls prevention discussed Falls evaluation completed   Falls evaluation completed    MEDICARE RISK AT HOME: Medicare Risk at Home Any stairs in or around the home?: Yes If so, are there any without  handrails?: No Home free of loose throw rugs in walkways, pet beds, electrical cords, etc?: Yes Adequate lighting in your home to reduce risk of falls?: Yes Life alert?: No Use of a cane, walker or w/c?: No Grab bars in the bathroom?: Yes Shower chair or bench in shower?: No Elevated toilet seat or a handicapped toilet?: Yes  TIMED UP AND GO:  Was the test performed?  No    Cognitive Function:        05/15/2023    2:03 PM 06/16/2022   11:59 AM  6CIT Screen  What Year? 0 points 0 points  What month? 0 points 0 points  What time? 0 points 0 points  Count back from 20 0 points 0 points  Months in reverse 0 points 0 points  Repeat phrase 0 points 0 points  Total Score 0 points 0 points    Immunizations Immunization History  Administered Date(s) Administered   Fluad Quad(high Dose 65+) 04/03/2021, 03/14/2023   H1N1 03/31/2022   Influenza, Quadrivalent, Recombinant, Inj, Pf 04/05/2020   Influenza,inj,Quad PF,6+ Mos 02/27/2016   Influenza,inj,quad, With Preservative 02/27/2016   Influenza-Unspecified 03/14/2022   Moderna Covid-19 Vaccine Bivalent Booster 64yrs & up 03/10/2022   PFIZER(Purple Top)SARS-COV-2 Vaccination 09/23/2019, 04/12/2020, 08/29/2020, 01/26/2021, 03/22/2023   PNEUMOCOCCAL CONJUGATE-20 05/26/2021   Pfizer(Comirnaty)Fall Seasonal Vaccine 12 years  and older 12/21/2022   Pneumococcal Polysaccharide-23 07/05/2019   RSV,unspecified 06/07/2022   Rabies, IM 09/25/2012, 09/28/2012, 10/02/2012, 10/09/2012   Rabies, intradermal 09/25/2012   Tdap 11/03/2016   Zoster Recombinant(Shingrix) 04/23/2018, 08/30/2018    TDAP status: Up to date  Flu Vaccine status: Up to date  Pneumococcal vaccine status: Up to date  Covid-19 vaccine status: Completed vaccines  Qualifies for Shingles Vaccine? No   Zostavax completed Yes   Shingrix Completed?: Yes  Screening Tests Health Maintenance  Topic Date Due   COVID-19 Vaccine (8 - 2023-24 season) 05/17/2023   Medicare  Annual Wellness (AWV)  05/14/2024   MAMMOGRAM  10/29/2024   Colonoscopy  06/27/2026   DTaP/Tdap/Td (2 - Td or Tdap) 11/04/2026   Pneumonia Vaccine 49+ Years old  Completed   INFLUENZA VACCINE  Completed   DEXA SCAN  Completed   Hepatitis C Screening  Completed   Zoster Vaccines- Shingrix  Completed   HPV VACCINES  Aged Out    Health Maintenance  Health Maintenance Due  Topic Date Due   COVID-19 Vaccine (8 - 2023-24 season) 05/17/2023    Colorectal cancer screening: Type of screening: Colonoscopy. Completed 2022. Repeat every 7 years  Mammogram status: Completed  . Repeat every year  Bone Density status: Completed 2023. Results reflect: Bone density results: OSTEOPENIA. Repeat every 5 years.  Lung Cancer Screening: (Low Dose CT Chest recommended if Age 55-80 years, 20 pack-year currently smoking OR have quit w/in 15years.) does not qualify.   Lung Cancer Screening Referral:   Additional Screening:  Hepatitis C Screening: does not qualify; Completed 2018  Vision Screening: Recommended annual ophthalmology exams for early detection of glaucoma and other disorders of the eye. Is the patient up to date with their annual eye exam?  Yes  Who is the provider or what is the name of the office in which the patient attends annual eye exams? [facility] If pt is not established with a provider, would they like to be referred to a provider to establish care? No .   Dental Screening: Recommended annual dental exams for proper oral hygiene    Community Resource Referral / Chronic Care Management: CRR required this visit?  No   CCM required this visit?  No     Plan:     I have personally reviewed and noted the following in the patient's chart:   Medical and social history Use of alcohol, tobacco or illicit drugs  Current medications and supplements including opioid prescriptions. Patient is not currently taking opioid prescriptions. Functional ability and status Nutritional  status Physical activity Advanced directives List of other physicians Hospitalizations, surgeries, and ER visits in previous 12 months Vitals Screenings to include cognitive, depression, and falls Referrals and appointments  In addition, I have reviewed and discussed with patient certain preventive protocols, quality metrics, and best practice recommendations. A written personalized care plan for preventive services as well as general preventive health recommendations were provided to patient.     Remi Haggard, LPN   16/07/958   After Visit Summary: (MyChart) Due to this being a telephonic visit, the after visit summary with patients personalized plan was offered to patient via MyChart   Nurse Notes:

## 2023-05-15 NOTE — Patient Instructions (Signed)
[name] , Thank you for taking time to come for your Medicare Wellness Visit. I appreciate your ongoing commitment to your health goals. Please review the following plan we discussed and let me know if I can assist you in the future.  Screening recommendations/referrals: Colonoscopy: up to date Mammogram: up to date Bone Density: up to date Recommended yearly ophthalmology/optometry visit for glaucoma screening and checkup Recommended yearly dental visit for hygiene and checkup  Vaccinations: Influenza vaccine: up to date Pneumococcal vaccine: up to date Tdap vaccine: up to date Shingles vaccine: up to date    Advanced directives: yes not on fill   Preventive Care 65 Years and Older, Female Preventive care refers to lifestyle choices and visits with your health care provider that can promote health and wellness. What does preventive care include? A yearly physical exam. This is also called an annual well check. Dental exams once or twice a year. Routine eye exams. Ask your health care provider how often you should have your eyes checked. Personal lifestyle choices, including: Daily care of your teeth and gums. Regular physical activity. Eating a healthy diet. Avoiding tobacco and drug use. Limiting alcohol use. Practicing safe sex. Taking low-dose aspirin every day. Taking vitamin and mineral supplements as recommended by your health care provider. What happens during an annual well check? The services and screenings done by your health care provider during your annual well check will depend on your age, overall health, lifestyle risk factors, and family history of disease. Counseling  Your health care provider may ask you questions about your: Alcohol use. Tobacco use. Drug use. Emotional well-being. Home and relationship well-being. Sexual activity. Eating habits. History of falls. Memory and ability to understand (cognition). Work and work  Astronomer. Reproductive health. Screening  You may have the following tests or measurements: Height, weight, and BMI. Blood pressure. Lipid and cholesterol levels. These may be checked every 5 years, or more frequently if you are over 40 years old. Skin check. Lung cancer screening. You may have this screening every year starting at age 58 if you have a 30-pack-year history of smoking and currently smoke or have quit within the past 15 years. Fecal occult blood test (FOBT) of the stool. You may have this test every year starting at age 98. Flexible sigmoidoscopy or colonoscopy. You may have a sigmoidoscopy every 5 years or a colonoscopy every 10 years starting at age 65. Hepatitis C blood test. Hepatitis B blood test. Sexually transmitted disease (STD) testing. Diabetes screening. This is done by checking your blood sugar (glucose) after you have not eaten for a while (fasting). You may have this done every 1-3 years. Bone density scan. This is done to screen for osteoporosis. You may have this done starting at age 17. Mammogram. This may be done every 1-2 years. Talk to your health care provider about how often you should have regular mammograms. Talk with your health care provider about your test results, treatment options, and if necessary, the need for more tests. Vaccines  Your health care provider may recommend certain vaccines, such as: Influenza vaccine. This is recommended every year. Tetanus, diphtheria, and acellular pertussis (Tdap, Td) vaccine. You may need a Td booster every 10 years. Zoster vaccine. You may need this after age 70. Pneumococcal 13-valent conjugate (PCV13) vaccine. One dose is recommended after age 9. Pneumococcal polysaccharide (PPSV23) vaccine. One dose is recommended after age 29. Talk to your health care provider about which screenings and vaccines you need and how  often you need them. This information is not intended to replace advice given to you by  your health care provider. Make sure you discuss any questions you have with your health care provider. Document Released: 07/23/2015 Document Revised: 03/15/2016 Document Reviewed: 04/27/2015 Elsevier Interactive Patient Education  2017 [employer].  Fall Prevention in the Home Falls can cause injuries. They can happen to people of all ages. There are many things you can do to make your home safe and to help prevent falls. What can I do on the outside of my home? Regularly fix the edges of walkways and driveways and fix any cracks. Remove anything that might make you trip as you walk through a door, such as a raised step or threshold. Trim any bushes or trees on the path to your home. Use bright outdoor lighting. Clear any walking paths of anything that might make someone trip, such as rocks or tools. Regularly check to see if handrails are loose or broken. Make sure that both sides of any steps have handrails. Any raised decks and porches should have guardrails on the edges. Have any leaves, snow, or ice cleared regularly. Use sand or salt on walking paths during winter. Clean up any spills in your garage right away. This includes oil or grease spills. What can I do in the bathroom? Use night lights. Install grab bars by the toilet and in the tub and shower. Do not use towel bars as grab bars. Use non-skid mats or decals in the tub or shower. If you need to sit down in the shower, use a plastic, non-slip stool. Keep the floor dry. Clean up any water that spills on the floor as soon as it happens. Remove soap buildup in the tub or shower regularly. Attach bath mats securely with double-sided non-slip rug tape. Do not have throw rugs and other things on the floor that can make you trip. What can I do in the bedroom? Use night lights. Make sure that you have a light by your bed that is easy to reach. Do not use any sheets or blankets that are too big for your bed. They should not hang  down onto the floor. Have a firm chair that has side arms. You can use this for support while you get dressed. Do not have throw rugs and other things on the floor that can make you trip. What can I do in the kitchen? Clean up any spills right away. Avoid walking on wet floors. Keep items that you use a lot in easy-to-reach places. If you need to reach something above you, use a strong step stool that has a grab bar. Keep electrical cords out of the way. Do not use floor polish or wax that makes floors slippery. If you must use wax, use non-skid floor wax. Do not have throw rugs and other things on the floor that can make you trip. What can I do with my stairs? Do not leave any items on the stairs. Make sure that there are handrails on both sides of the stairs and use them. Fix handrails that are broken or loose. Make sure that handrails are as long as the stairways. Check any carpeting to make sure that it is firmly attached to the stairs. Fix any carpet that is loose or worn. Avoid having throw rugs at the top or bottom of the stairs. If you do have throw rugs, attach them to the floor with carpet tape. Make sure that you have a  light switch at the top of the stairs and the bottom of the stairs. If you do not have them, ask someone to add them for you. What else can I do to help prevent falls? Wear shoes that: Do not have high heels. Have rubber bottoms. Are comfortable and fit you well. Are closed at the toe. Do not wear sandals. If you use a stepladder: Make sure that it is fully opened. Do not climb a closed stepladder. Make sure that both sides of the stepladder are locked into place. Ask someone to hold it for you, if possible. Clearly mark and make sure that you can see: Any grab bars or handrails. First and last steps. Where the edge of each step is. Use tools that help you move around (mobility aids) if they are needed. These  include: Canes. Walkers. Scooters. Crutches. Turn on the lights when you go into a dark area. Replace any light bulbs as soon as they burn out. Set up your furniture so you have a clear path. Avoid moving your furniture around. If any of your floors are uneven, fix them. If there are any pets around you, be aware of where they are. Review your medicines with your doctor. Some medicines can make you feel dizzy. This can increase your chance of falling. Ask your doctor what other things that you can do to help prevent falls. This information is not intended to replace advice given to you by your health care provider. Make sure you discuss any questions you have with your health care provider. Document Released: 04/22/2009 Document Revised: 12/02/2015 Document Reviewed: 07/31/2014 Elsevier Interactive Patient Education  2017 [employer].

## 2023-05-31 ENCOUNTER — Encounter (HOSPITAL_BASED_OUTPATIENT_CLINIC_OR_DEPARTMENT_OTHER): Payer: Self-pay | Admitting: Family Medicine

## 2023-05-31 ENCOUNTER — Other Ambulatory Visit (HOSPITAL_BASED_OUTPATIENT_CLINIC_OR_DEPARTMENT_OTHER): Payer: Self-pay | Admitting: Obstetrics & Gynecology

## 2023-05-31 DIAGNOSIS — Z8659 Personal history of other mental and behavioral disorders: Secondary | ICD-10-CM

## 2023-06-05 ENCOUNTER — Other Ambulatory Visit (HOSPITAL_BASED_OUTPATIENT_CLINIC_OR_DEPARTMENT_OTHER): Payer: Self-pay | Admitting: *Deleted

## 2023-06-05 ENCOUNTER — Encounter (HOSPITAL_BASED_OUTPATIENT_CLINIC_OR_DEPARTMENT_OTHER): Payer: Self-pay | Admitting: Obstetrics & Gynecology

## 2023-06-05 DIAGNOSIS — Z8659 Personal history of other mental and behavioral disorders: Secondary | ICD-10-CM

## 2023-06-05 MED ORDER — BUPROPION HCL ER (XL) 150 MG PO TB24: 150.0000 mg | ORAL_TABLET | Freq: Every day | ORAL | 0 refills | Status: DC

## 2023-06-19 DIAGNOSIS — L821 Other seborrheic keratosis: Secondary | ICD-10-CM | POA: Diagnosis not present

## 2023-06-19 DIAGNOSIS — Z85828 Personal history of other malignant neoplasm of skin: Secondary | ICD-10-CM | POA: Diagnosis not present

## 2023-06-19 DIAGNOSIS — L814 Other melanin hyperpigmentation: Secondary | ICD-10-CM | POA: Diagnosis not present

## 2023-06-19 DIAGNOSIS — L820 Inflamed seborrheic keratosis: Secondary | ICD-10-CM | POA: Diagnosis not present

## 2023-07-17 ENCOUNTER — Ambulatory Visit (HOSPITAL_BASED_OUTPATIENT_CLINIC_OR_DEPARTMENT_OTHER): Payer: Medicare PPO | Admitting: Obstetrics & Gynecology

## 2023-07-17 ENCOUNTER — Encounter (HOSPITAL_BASED_OUTPATIENT_CLINIC_OR_DEPARTMENT_OTHER): Payer: Self-pay | Admitting: Obstetrics & Gynecology

## 2023-07-17 VITALS — BP 150/90 | HR 104 | Ht 62.25 in | Wt 158.8 lb

## 2023-07-17 DIAGNOSIS — Z6828 Body mass index (BMI) 28.0-28.9, adult: Secondary | ICD-10-CM | POA: Diagnosis not present

## 2023-07-17 DIAGNOSIS — Z8249 Family history of ischemic heart disease and other diseases of the circulatory system: Secondary | ICD-10-CM | POA: Diagnosis not present

## 2023-07-17 DIAGNOSIS — Z01419 Encounter for gynecological examination (general) (routine) without abnormal findings: Secondary | ICD-10-CM | POA: Diagnosis not present

## 2023-07-17 DIAGNOSIS — Z780 Asymptomatic menopausal state: Secondary | ICD-10-CM | POA: Diagnosis not present

## 2023-07-17 DIAGNOSIS — R7303 Prediabetes: Secondary | ICD-10-CM

## 2023-07-17 DIAGNOSIS — M8588 Other specified disorders of bone density and structure, other site: Secondary | ICD-10-CM | POA: Diagnosis not present

## 2023-07-17 DIAGNOSIS — E785 Hyperlipidemia, unspecified: Secondary | ICD-10-CM

## 2023-07-17 DIAGNOSIS — Z124 Encounter for screening for malignant neoplasm of cervix: Secondary | ICD-10-CM

## 2023-07-17 DIAGNOSIS — I10 Essential (primary) hypertension: Secondary | ICD-10-CM

## 2023-07-17 NOTE — Addendum Note (Signed)
 Addended by: Jerene Bears on: 07/17/2023 03:08 PM   Modules accepted: Orders

## 2023-07-17 NOTE — Patient Instructions (Signed)
 [facility] at [facility] Address: [address] [place], [place], [place] [address] Phone: [phone]

## 2023-07-17 NOTE — Progress Notes (Addendum)
 68 y.o. G0P0 Married White or Caucasian female here for breast and pelvic exam.  I am also following her for increased risks of breast cancer.  We have been following with MMG and MRI.   Last MMG was 10/30/2022.  Denies vaginal bleeding.  Blood pressure elevated today.  She has been checking at home and has been recording these.  Blood pressures are mostly in the 120s/70s.    Frustrated with weight.  BMI almost 29.  Has pre diabetes.  Cholesterol is around 255.  States she wants to be proactive.  Has some family hx of CVD with bypass and then afib.  He has multiple pacemakers placed during the later part of his life.  She sometime feels heart fluttering at night.  Would like to be proactive.  Would like to see Dr. Waddell who saw her father.    Patient's last menstrual period was 07/11/2011.          Sexually active: No.  H/O STD:  no  Health Maintenance: PCP:  de [place].  Last wellness appt was 10/2022.  Did blood work at that appt:  yes Vaccines are up to date:  yes Colonoscopy:  06/27/2021  MMG:  10/30/2022 Negative BMD:  10/26/2021 Osteopenia Last pap smear:  04/04/2021 Negative.   H/o abnormal pap smear:      reports that she has never smoked. She has never used smokeless tobacco. She reports that she does not currently use alcohol after a past usage of about 3.0 standard drinks of alcohol per week. She reports that she does not use drugs.  Past Medical History:  Diagnosis Date   Anxiety    situational anxiety   GERD (gastroesophageal reflux disease)    with certain foods-OTC PRN meds   Hx of colonic polyp - sessile serrated polyp 04/06/2015   Hyperlipidemia    diet controlled-no meds   Osteopenia    Seasonal allergies    Spondylolisthesis    Vitamin D  deficiency     Past Surgical History:  Procedure Laterality Date   BREAST EXCISIONAL BIOPSY Right 2002   BREAST LUMPECTOMY Right 2002   Benign excision   COLONOSCOPY  2016   CG-miralax (good)   GYNECOLOGIC CRYOSURGERY   1987   TUBAL LIGATION  1990   WISDOM TOOTH EXTRACTION      Current Outpatient Medications  Medication Sig Dispense Refill   buPROPion  (WELLBUTRIN  XL) 150 MG 24 hr tablet Take 1 tablet (150 mg total) by mouth daily. 90 tablet 0   Cholecalciferol 4000 units CAPS Take 1 capsule by mouth daily.     famotidine (PEPCID) 20 MG tablet Take 20 mg by mouth daily as needed.     fluticasone (FLONASE) 50 MCG/ACT nasal spray Place 2 sprays into both nostrils daily as needed.     PSYLLIUM PO Take 1 packet by mouth daily. FIBER     temazepam  (RESTORIL ) 15 MG capsule TAKE 1-2 CAPSULES NIGHTLY AS NEEDED FOR SLEEP. *INS COVERS 1.3 CAPS/DAY* 30 capsule 0   No current facility-administered medications for this visit.    Family History  Problem Relation Age of Onset   Colon polyps Mother 43   Breast cancer Mother 78   Osteoporosis Mother    Colon cancer Mother 31   Other Mother        Dec from blood disorder   Diabetes Father        controlled by diet   Hypertension Father        ?  Heart disease Father        bypass surgery and pacemaker   Kidney disease Father    Diabetes Maternal Grandfather    Breast cancer Paternal Grandmother 71       had mastectomy in 1940 or 1950 ? if secondary to cancer   Colon polyps Paternal Grandfather 36   Colon cancer Paternal Grandfather 72   Colon cancer Cousin        30s   Esophageal cancer Neg Hx    Rectal cancer Neg Hx    Stomach cancer Neg Hx     Review of Systems  Constitutional: Negative.   Genitourinary: Negative.     Exam:   BP (!) 150/90   Pulse (!) 104   Ht 5' 2.25 (1.581 m)   Wt 158 lb 12.8 oz (72 kg)   LMP 07/11/2011   BMI 28.81 kg/m   Height: 5' 2.25 (158.1 cm)  General appearance: alert, cooperative and appears stated age Breasts: normal appearance, no masses or tenderness Abdomen: soft, non-tender; bowel sounds normal; no masses,  no organomegaly Lymph nodes: Cervical, supraclavicular, and axillary nodes normal.  No abnormal  inguinal nodes palpated Neurologic: Grossly normal  Pelvic: External genitalia:  no lesions              Urethra:  normal appearing urethra with no masses, tenderness or lesions              Bartholins and Skenes: normal                 Vagina: normal appearing vagina with atrophic changes and no discharge, no lesions              Cervix: no lesions              Pap taken: no Bimanual Exam:  Uterus:  normal size, contour, position, consistency, mobility, non-tender              Adnexa: normal adnexa and no mass, fullness, tenderness               Rectovaginal: Confirms               Anus:  normal sphincter tone, no lesions  Chaperone, Bascom kotyk, CMA, was present for exam.  Assessment/Plan: 1. Encntr for gyn exam (general) (routine) w/o abn findings (Primary) - Pap smear guidelines discussed.  Will repeat next year. - Mammogram 10/2022.  Will plan MRI in October time frame - Colonoscopy 06/27/2021, follow up 5 years - Bone mineral density 10/2021 - lab work done with PCP, Dr. De [place] - vaccines reviewed/updated  2. White coat syndrome with diagnosis of hypertension - pt is checking blood pressures at home  3. Menopause  4. Osteopenia of lumbar spine  5. BMI 28.0-28.9,adult - Ambulatory referral to [facility]  6. Prediabetes - Ambulatory referral to [facility]  7. Family history of cardiovascular disease - Ambulatory referral to Cardiology  8. Elevated lipids

## 2023-07-23 DIAGNOSIS — M79672 Pain in left foot: Secondary | ICD-10-CM | POA: Diagnosis not present

## 2023-07-23 DIAGNOSIS — S92342A Displaced fracture of fourth metatarsal bone, left foot, initial encounter for closed fracture: Secondary | ICD-10-CM | POA: Diagnosis not present

## 2023-07-29 DIAGNOSIS — M79672 Pain in left foot: Secondary | ICD-10-CM | POA: Diagnosis not present

## 2023-08-03 ENCOUNTER — Other Ambulatory Visit: Payer: Self-pay | Admitting: Orthopaedic Surgery

## 2023-08-03 ENCOUNTER — Ambulatory Visit
Admission: RE | Admit: 2023-08-03 | Discharge: 2023-08-03 | Disposition: A | Discharge status: Home / Self Care | Payer: Medicare PPO | Source: Ambulatory Visit | Attending: Orthopaedic Surgery | Admitting: Orthopaedic Surgery

## 2023-08-03 DIAGNOSIS — S92335A Nondisplaced fracture of third metatarsal bone, left foot, initial encounter for closed fracture: Secondary | ICD-10-CM | POA: Diagnosis not present

## 2023-08-03 DIAGNOSIS — S92245A Nondisplaced fracture of medial cuneiform of left foot, initial encounter for closed fracture: Secondary | ICD-10-CM | POA: Diagnosis not present

## 2023-08-03 DIAGNOSIS — S92325A Nondisplaced fracture of second metatarsal bone, left foot, initial encounter for closed fracture: Secondary | ICD-10-CM | POA: Diagnosis not present

## 2023-08-03 DIAGNOSIS — S92345A Nondisplaced fracture of fourth metatarsal bone, left foot, initial encounter for closed fracture: Secondary | ICD-10-CM | POA: Diagnosis not present

## 2023-08-03 DIAGNOSIS — S92902S Unspecified fracture of left foot, sequela: Secondary | ICD-10-CM

## 2023-08-10 DIAGNOSIS — S92342A Displaced fracture of fourth metatarsal bone, left foot, initial encounter for closed fracture: Secondary | ICD-10-CM | POA: Insufficient documentation

## 2023-08-10 DIAGNOSIS — S92242A Displaced fracture of medial cuneiform of left foot, initial encounter for closed fracture: Secondary | ICD-10-CM | POA: Insufficient documentation

## 2023-08-17 DIAGNOSIS — S92335A Nondisplaced fracture of third metatarsal bone, left foot, initial encounter for closed fracture: Secondary | ICD-10-CM | POA: Diagnosis not present

## 2023-08-17 DIAGNOSIS — S92345A Nondisplaced fracture of fourth metatarsal bone, left foot, initial encounter for closed fracture: Secondary | ICD-10-CM | POA: Diagnosis not present

## 2023-08-17 DIAGNOSIS — S92245A Nondisplaced fracture of medial cuneiform of left foot, initial encounter for closed fracture: Secondary | ICD-10-CM | POA: Diagnosis not present

## 2023-08-31 ENCOUNTER — Other Ambulatory Visit (HOSPITAL_BASED_OUTPATIENT_CLINIC_OR_DEPARTMENT_OTHER): Payer: Self-pay | Admitting: Certified Nurse Midwife

## 2023-08-31 DIAGNOSIS — S92345A Nondisplaced fracture of fourth metatarsal bone, left foot, initial encounter for closed fracture: Secondary | ICD-10-CM | POA: Diagnosis not present

## 2023-08-31 DIAGNOSIS — S92335A Nondisplaced fracture of third metatarsal bone, left foot, initial encounter for closed fracture: Secondary | ICD-10-CM | POA: Diagnosis not present

## 2023-08-31 DIAGNOSIS — S92245A Nondisplaced fracture of medial cuneiform of left foot, initial encounter for closed fracture: Secondary | ICD-10-CM | POA: Diagnosis not present

## 2023-08-31 DIAGNOSIS — Z8659 Personal history of other mental and behavioral disorders: Secondary | ICD-10-CM

## 2023-08-31 DIAGNOSIS — S92325A Nondisplaced fracture of second metatarsal bone, left foot, initial encounter for closed fracture: Secondary | ICD-10-CM | POA: Diagnosis not present

## 2023-09-27 ENCOUNTER — Other Ambulatory Visit: Payer: Self-pay | Admitting: Obstetrics & Gynecology

## 2023-09-27 DIAGNOSIS — Z1231 Encounter for screening mammogram for malignant neoplasm of breast: Secondary | ICD-10-CM

## 2023-10-08 ENCOUNTER — Encounter: Payer: Self-pay | Admitting: Cardiovascular Disease

## 2023-10-08 ENCOUNTER — Ambulatory Visit: Payer: Medicare PPO | Attending: Cardiovascular Disease | Admitting: Cardiovascular Disease

## 2023-10-08 VITALS — BP 164/90 | HR 98 | Ht 62.25 in | Wt 158.4 lb

## 2023-10-08 DIAGNOSIS — Z7189 Other specified counseling: Secondary | ICD-10-CM

## 2023-10-08 NOTE — Patient Instructions (Signed)
 Medication Instructions:  No changes *If you need a refill on your cardiac medications before your next appointment, please call your pharmacy*  Lab Work: none   Testing/Procedures: Your physician has requested that you have an echocardiogram. Echocardiography is a painless test that uses sound waves to create images of your heart. It provides your doctor with information about the size and shape of your heart and how well your heart's chambers and valves are working. This procedure takes approximately one hour. There are no restrictions for this procedure. Please do NOT wear cologne, perfume, aftershave, or lotions (deodorant is allowed). Please arrive 15 minutes prior to your appointment time.  Please note: We ask at that you not bring children with you during ultrasound (echo/ vascular) testing. Due to room size and safety concerns, children are not allowed in the ultrasound rooms during exams. Our front office staff cannot provide observation of children in our lobby area while testing is being conducted. An adult accompanying a patient to their appointment will only be allowed in the ultrasound room at the discretion of the ultrasound technician under special circumstances. We apologize for any inconvenience.  Calcium Score CT Scan  Follow-Up: At [facility], you and your health needs are our priority.  As part of our continuing mission to provide you with exceptional heart care, our providers are all part of one team.  This team includes your primary Cardiologist (physician) and Advanced Practice Providers or APPs (Physician Assistants and Nurse Practitioners) who all work together to provide you with the care you need, when you need it.  Your next appointment:   2 year(s)  Provider:   Verne Carrow, MD     We recommend signing up for the patient portal called "MyChart".  Sign up information is provided on this After Visit Summary.  MyChart is used to connect with  patients for Virtual Visits (Telemedicine).  Patients are able to view lab/test results, encounter notes, upcoming appointments, etc.  Non-urgent messages can be sent to your provider as well.   To learn more about what you can do with MyChart, go to [email or web address].      1st Floor: - Lobby - Registration  - Pharmacy  - Lab - Cafe  2nd Floor: - PV Lab - Diagnostic Testing (echo, CT, nuclear med)  3rd Floor: - Vacant  4th Floor: - TCTS (cardiothoracic surgery) - AFib Clinic - Structural Heart Clinic - Vascular Surgery  - Vascular Ultrasound  5th Floor: - HeartCare Cardiology (general and EP) - Clinical Pharmacy for coumadin, hypertension, lipid, weight-loss medications, and med management appointments    Valet parking services will be available as well.

## 2023-10-08 NOTE — Progress Notes (Signed)
 Chief Complaint  Patient presents with   New Patient (Initial Visit)    Cardiac risk evaluation   History of Present Illness:68 yo female with history of GERD and HLD here today as a new consult, referred by Dr. de [place], for the evaluation of cardiac risk. She tells me that he feels well. She has no chest pain, dyspnea or palpitations. Her blood pressure is well controlled at home. Her father had CAD with bypass and then TAVR.   Primary Care Physician: de [place], Raymond J, MD   Past Medical History:  Diagnosis Date   Anxiety    situational anxiety   GERD (gastroesophageal reflux disease)    with certain foods-OTC PRN meds   Hx of colonic polyp - sessile serrated polyp 04/06/2015   Hyperlipidemia    diet controlled-no meds   Osteopenia    Seasonal allergies    Spondylolisthesis    Vitamin D deficiency     Past Surgical History:  Procedure Laterality Date   BREAST EXCISIONAL BIOPSY Right 2002   BREAST LUMPECTOMY Right 2002   Benign excision   COLONOSCOPY  2016   CG-miralax (good)   GYNECOLOGIC CRYOSURGERY  1987   TUBAL LIGATION  1990   WISDOM TOOTH EXTRACTION      Current Outpatient Medications  Medication Sig Dispense Refill   buPROPion (WELLBUTRIN XL) 150 MG 24 hr tablet TAKE 1 TABLET BY MOUTH EVERY DAY 90 tablet 3   Cholecalciferol 4000 units CAPS Take 1 capsule by mouth daily.     famotidine (PEPCID) 20 MG tablet Take 20 mg by mouth daily as needed.     fluticasone (FLONASE) 50 MCG/ACT nasal spray Place 2 sprays into both nostrils daily as needed.     meloxicam (MOBIC) 7.5 MG tablet Take 1 tablet by mouth daily as needed.     PSYLLIUM PO Take 1 packet by mouth daily. FIBER     temazepam (RESTORIL) 15 MG capsule TAKE 1-2 CAPSULES NIGHTLY AS NEEDED FOR SLEEP. *INS COVERS 1.3 CAPS/DAY* 30 capsule 0   No current facility-administered medications for this visit.    Allergies  Allergen Reactions   Penicillins     RASH   Sulfa Antibiotics     Social History    Socioeconomic History   Marital status: Married    Spouse name: Not on file   Number of children: 0   Years of education: Not on file   Highest education level: Not on file  Occupational History   Occupation: Retired Pension scheme manager  Tobacco Use   Smoking status: Never   Smokeless tobacco: Never  Vaping Use   Vaping status: Never Used  Substance and Sexual Activity   Alcohol use: Yes    Alcohol/week: 3.0 standard drinks of alcohol    Types: 3 Glasses of wine per week   Drug use: No   Sexual activity: Yes    Partners: Male    Birth control/protection: Post-menopausal, Surgical    Comment: BTL  Other Topics Concern   Not on file  Social History Narrative   Not on file   Social Drivers of Health   Financial Resource Strain: Not on file  Food Insecurity: No Food Insecurity (05/15/2023)   Hunger Vital Sign    Worried About Running Out of Food in the Last Year: Never true    Ran Out of Food in the Last Year: Never true  Transportation Needs: No Transportation Needs (05/15/2023)   PRAPARE - Transportation    Lack  of Transportation (Medical): No    Lack of Transportation (Non-Medical): No  Physical Activity: Sufficiently Active (05/15/2023)   Exercise Vital Sign    Days of Exercise per Week: 4 days    Minutes of Exercise per Session: 60 min  Stress: Not on file  Social Connections: Socially Integrated (05/15/2023)   Social Connection and Isolation Panel [NHANES]    Frequency of Communication with Friends and Family: More than three times a week    Frequency of Social Gatherings with Friends and Family: More than three times a week    Attends Religious Services: More than 4 times per year    Active Member of [employer] or Organizations: Yes    Attends Banker Meetings: More than 4 times per year    Marital Status: Married  Catering manager Violence: Not At Risk (05/15/2023)   Humiliation, Afraid, Rape, and Kick questionnaire    Fear of Current or  Ex-Partner: No    Emotionally Abused: No    Physically Abused: No    Sexually Abused: No    Family History  Problem Relation Age of Onset   Colon polyps Mother 45   Breast cancer Mother 63   Osteoporosis Mother    Colon cancer Mother 34   Other Mother        Dec from blood disorder   Diabetes Father        controlled by diet   Hypertension Father        ?   Heart disease Father        bypass surgery and pacemaker   Kidney disease Father    Diabetes Maternal Grandfather    Breast cancer Paternal Grandmother 67       had mastectomy in 1940 or 1950 ? if secondary to cancer   Colon polyps Paternal Grandfather 81   Colon cancer Paternal Grandfather 68   Colon cancer Cousin        30s   Esophageal cancer Neg Hx    Rectal cancer Neg Hx    Stomach cancer Neg Hx     Review of Systems:  As stated in the HPI and otherwise negative.   BP (!) 164/90   Pulse 98   Ht 5' 2.25" (1.581 m)   Wt 71.8 kg   LMP 07/11/2011   SpO2 97%   BMI 28.74 kg/m   Physical Examination: General: Well developed, well nourished, NAD  HEENT: OP clear, mucus membranes moist  SKIN: warm, dry. No rashes. Neuro: No focal deficits  Musculoskeletal: Muscle strength 5/5 all ext  Psychiatric: Mood and affect normal  Neck: No JVD, no carotid bruits, no thyromegaly, no lymphadenopathy.  Lungs:Clear bilaterally, no wheezes, rhonci, crackles Cardiovascular: Regular rate and rhythm. No murmurs, gallops or rubs. Abdomen:Soft. Bowel sounds present. Non-tender.  Extremities: No lower extremity edema. Pulses are 2 + in the bilateral DP/PT.  EKG:  EKG is ordered today. The ekg ordered today demonstrates  EKG Interpretation Date/Time:  Monday October 08 2023 08:42:39 EDT Ventricular Rate:  86 PR Interval:  156 QRS Duration:  82 QT Interval:  354 QTC Calculation: 423 R Axis:   21  Text Interpretation: Normal sinus rhythm Normal ECG Confirmed by Verne Carrow [phone]) on 10/08/2023 8:45:29 AM   Recent  Labs: 10/16/2022: ALT 16; BUN 21; Creatinine, Ser 0.96; Hemoglobin 13.2; Platelets 275; Potassium 4.8; Sodium 140; TSH 3.030   Lipid Panel    Component Value Date/Time   CHOL 236 (H) 10/16/2022 0818   TRIG 72  10/16/2022 0818   HDL 69 10/16/2022 0818   CHOLHDL 3.4 10/16/2022 0818   LDLCALC 155 (H) 10/16/2022 0818     Wt Readings from Last 3 Encounters:  10/08/23 71.8 kg  07/17/23 72 kg  10/26/22 68.9 kg    Assessment and Plan:   1. Cardiac risk assessment: EKG is normal today. Given her age and FH of CAD, will arrange a CT coronary calcium score to assess for evidence of CAD. If her calcium score is abnormal, will start an ASA and statin. Will arrange an echo to assess LV size and function.   Labs/ tests ordered today include:  Orders Placed This Encounter  Procedures   CT CARDIAC SCORING (SELF PAY ONLY)   EKG 12-Lead   ECHOCARDIOGRAM COMPLETE   Disposition:   F/U with me in 2 years   Signed, Verne Carrow, MD, [facility] 10/08/2023 9:24 AM    [facility] [address] [place], [place], [place]  [address] Phone: [phone]; Fax: [phone]

## 2023-10-12 DIAGNOSIS — S92345A Nondisplaced fracture of fourth metatarsal bone, left foot, initial encounter for closed fracture: Secondary | ICD-10-CM | POA: Diagnosis not present

## 2023-10-12 DIAGNOSIS — S92335A Nondisplaced fracture of third metatarsal bone, left foot, initial encounter for closed fracture: Secondary | ICD-10-CM | POA: Diagnosis not present

## 2023-10-12 DIAGNOSIS — S92245A Nondisplaced fracture of medial cuneiform of left foot, initial encounter for closed fracture: Secondary | ICD-10-CM | POA: Diagnosis not present

## 2023-10-12 DIAGNOSIS — S92325A Nondisplaced fracture of second metatarsal bone, left foot, initial encounter for closed fracture: Secondary | ICD-10-CM | POA: Diagnosis not present

## 2023-10-22 DIAGNOSIS — H25813 Combined forms of age-related cataract, bilateral: Secondary | ICD-10-CM | POA: Diagnosis not present

## 2023-10-22 DIAGNOSIS — H04123 Dry eye syndrome of bilateral lacrimal glands: Secondary | ICD-10-CM | POA: Diagnosis not present

## 2023-10-22 DIAGNOSIS — H524 Presbyopia: Secondary | ICD-10-CM | POA: Diagnosis not present

## 2023-10-22 DIAGNOSIS — H11151 Pinguecula, right eye: Secondary | ICD-10-CM | POA: Diagnosis not present

## 2023-10-23 ENCOUNTER — Encounter (HOSPITAL_BASED_OUTPATIENT_CLINIC_OR_DEPARTMENT_OTHER): Payer: Self-pay | Admitting: *Deleted

## 2023-10-24 ENCOUNTER — Ambulatory Visit (HOSPITAL_BASED_OUTPATIENT_CLINIC_OR_DEPARTMENT_OTHER)
Admission: RE | Admit: 2023-10-24 | Discharge: 2023-10-24 | Disposition: A | Discharge status: Home / Self Care | Payer: Self-pay | Source: Ambulatory Visit | Attending: Cardiovascular Disease | Admitting: Cardiovascular Disease

## 2023-10-24 ENCOUNTER — Other Ambulatory Visit (HOSPITAL_BASED_OUTPATIENT_CLINIC_OR_DEPARTMENT_OTHER)

## 2023-10-24 ENCOUNTER — Other Ambulatory Visit (HOSPITAL_BASED_OUTPATIENT_CLINIC_OR_DEPARTMENT_OTHER): Payer: Self-pay | Admitting: Family Medicine

## 2023-10-24 DIAGNOSIS — D649 Anemia, unspecified: Secondary | ICD-10-CM | POA: Diagnosis not present

## 2023-10-24 DIAGNOSIS — Z7189 Other specified counseling: Secondary | ICD-10-CM | POA: Insufficient documentation

## 2023-10-24 DIAGNOSIS — E785 Hyperlipidemia, unspecified: Secondary | ICD-10-CM | POA: Diagnosis not present

## 2023-10-24 DIAGNOSIS — E559 Vitamin D deficiency, unspecified: Secondary | ICD-10-CM | POA: Diagnosis not present

## 2023-10-24 DIAGNOSIS — Z131 Encounter for screening for diabetes mellitus: Secondary | ICD-10-CM | POA: Diagnosis not present

## 2023-10-24 DIAGNOSIS — Z1329 Encounter for screening for other suspected endocrine disorder: Secondary | ICD-10-CM | POA: Diagnosis not present

## 2023-10-24 DIAGNOSIS — Z0000 Encounter for general adult medical examination without abnormal findings: Secondary | ICD-10-CM | POA: Diagnosis not present

## 2023-10-25 ENCOUNTER — Encounter (HOSPITAL_BASED_OUTPATIENT_CLINIC_OR_DEPARTMENT_OTHER): Payer: Self-pay | Admitting: Family Medicine

## 2023-10-25 LAB — COMPREHENSIVE METABOLIC PANEL WITH GFR
ALT: 21 IU/L (ref 0–32)
AST: 23 IU/L (ref 0–40)
Albumin: 4.6 g/dL (ref 3.9–4.9)
Alkaline Phosphatase: 67 IU/L (ref 44–121)
BUN/Creatinine Ratio: 22 (ref 12–28)
BUN: 19 mg/dL (ref 8–27)
Bilirubin Total: 0.4 mg/dL (ref 0.0–1.2)
CO2: 21 mmol/L (ref 20–29)
Calcium: 9.8 mg/dL (ref 8.7–10.3)
Chloride: 104 mmol/L (ref 96–106)
Creatinine, Ser: 0.86 mg/dL (ref 0.57–1.00)
Globulin, Total: 2.3 g/dL (ref 1.5–4.5)
Glucose: 91 mg/dL (ref 70–99)
Potassium: 5 mmol/L (ref 3.5–5.2)
Sodium: 140 mmol/L (ref 134–144)
Total Protein: 6.9 g/dL (ref 6.0–8.5)
eGFR: 74 mL/min/{1.73_m2} (ref >59)

## 2023-10-25 LAB — CBC WITH DIFFERENTIAL/PLATELET
Basophils Absolute: 0 10*3/uL (ref 0.0–0.2)
Basos: 1 %
EOS (ABSOLUTE): 0.2 10*3/uL (ref 0.0–0.4)
Eos: 3 %
Hematocrit: 40.8 % (ref 34.0–46.6)
Hemoglobin: 13.4 g/dL (ref 11.1–15.9)
Immature Grans (Abs): 0 10*3/uL (ref 0.0–0.1)
Immature Granulocytes: 0 %
Lymphocytes Absolute: 1.6 10*3/uL (ref 0.7–3.1)
Lymphs: 31 %
MCH: 30.3 pg (ref 26.6–33.0)
MCHC: 32.8 g/dL (ref 31.5–35.7)
MCV: 92 fL (ref 79–97)
Monocytes Absolute: 0.4 10*3/uL (ref 0.1–0.9)
Monocytes: 7 %
Neutrophils Absolute: 2.9 10*3/uL (ref 1.4–7.0)
Neutrophils: 58 %
Platelets: 280 10*3/uL (ref 150–450)
RBC: 4.42 x10E6/uL (ref 3.77–5.28)
RDW: 13.6 % (ref 11.7–15.4)
WBC: 5.1 10*3/uL (ref 3.4–10.8)

## 2023-10-25 LAB — HEMOGLOBIN A1C
Est. average glucose Bld gHb Est-mCnc: 117 mg/dL
Hgb A1c MFr Bld: 5.7 % — ABNORMAL HIGH (ref 4.8–5.6)

## 2023-10-25 LAB — LIPID PANEL
Chol/HDL Ratio: 3.3 ratio (ref 0.0–4.4)
Cholesterol, Total: 245 mg/dL — ABNORMAL HIGH (ref 100–199)
HDL: 74 mg/dL (ref >39)
LDL Chol Calc (NIH): 157 mg/dL — ABNORMAL HIGH (ref 0–99)
Triglycerides: 80 mg/dL (ref 0–149)
VLDL Cholesterol Cal: 14 mg/dL (ref 5–40)

## 2023-10-25 LAB — TSH RFX ON ABNORMAL TO FREE T4: TSH: 3.32 u[IU]/mL (ref 0.450–4.500)

## 2023-10-25 LAB — VITAMIN D 25 HYDROXY (VIT D DEFICIENCY, FRACTURES): Vit D, 25-Hydroxy: 61.2 ng/mL (ref 30.0–100.0)

## 2023-10-26 ENCOUNTER — Other Ambulatory Visit (HOSPITAL_BASED_OUTPATIENT_CLINIC_OR_DEPARTMENT_OTHER): Payer: Medicare PPO

## 2023-10-29 ENCOUNTER — Encounter (HOSPITAL_BASED_OUTPATIENT_CLINIC_OR_DEPARTMENT_OTHER): Payer: Medicare PPO | Admitting: Family Medicine

## 2023-10-31 ENCOUNTER — Ambulatory Visit (INDEPENDENT_AMBULATORY_CARE_PROVIDER_SITE_OTHER): Admitting: Family Medicine

## 2023-10-31 ENCOUNTER — Ambulatory Visit
Admission: RE | Admit: 2023-10-31 | Discharge: 2023-10-31 | Disposition: A | Discharge status: Home / Self Care | Source: Ambulatory Visit | Attending: Obstetrics & Gynecology | Admitting: Obstetrics & Gynecology

## 2023-10-31 ENCOUNTER — Encounter (HOSPITAL_BASED_OUTPATIENT_CLINIC_OR_DEPARTMENT_OTHER): Payer: Self-pay | Admitting: Family Medicine

## 2023-10-31 VITALS — BP 145/74 | HR 92 | Ht 62.0 in | Wt 160.2 lb

## 2023-10-31 DIAGNOSIS — Z0000 Encounter for general adult medical examination without abnormal findings: Secondary | ICD-10-CM | POA: Diagnosis not present

## 2023-10-31 DIAGNOSIS — R030 Elevated blood-pressure reading, without diagnosis of hypertension: Secondary | ICD-10-CM

## 2023-10-31 DIAGNOSIS — E785 Hyperlipidemia, unspecified: Secondary | ICD-10-CM | POA: Diagnosis not present

## 2023-10-31 DIAGNOSIS — Z1231 Encounter for screening mammogram for malignant neoplasm of breast: Secondary | ICD-10-CM | POA: Diagnosis not present

## 2023-10-31 NOTE — Assessment & Plan Note (Signed)

## 2023-10-31 NOTE — Assessment & Plan Note (Signed)
 Blood pressure slightly elevated today Patient denies any issues with sustained elevated blood pressure in the past, does report that she occasionally has high blood pressure readings during visits to the doctor's office Does check her BP at home and home readings mostly WNL Discussed lifestyle modifications, will continue to monitor, no medications needed for this at this time She will return for nurse visit in 1-2 weeks for BP recheck with home blood pressure cuff to compare

## 2023-10-31 NOTE — Assessment & Plan Note (Signed)
 Reviewed recent labs as well as CAC scoring which was 0. Given this would be reasonable to continue with lifestyle modifications and holding off on medication She has established with cardiologist and has follow-up arranged. Indicates plan for echo in the future as well

## 2023-10-31 NOTE — Progress Notes (Signed)
 Subjective:    CC: Annual Physical Exam  HPI:  Grace Morales is a 68 y.o. presenting for annual physical  I reviewed the past medical history, family history, social history, surgical history, and allergies today and no changes were needed.  Please see the problem list section below in epic for further details.  Past Medical History: Past Medical History:  Diagnosis Date   Anxiety    situational anxiety   GERD (gastroesophageal reflux disease)    with certain foods-OTC PRN meds   Hx of colonic polyp - sessile serrated polyp 04/06/2015   Hyperlipidemia    diet controlled-no meds   Osteopenia    Seasonal allergies    Spondylolisthesis    Vitamin D  deficiency    Past Surgical History: Past Surgical History:  Procedure Laterality Date   BREAST EXCISIONAL BIOPSY Right 2002   BREAST LUMPECTOMY Right 2002   Benign excision   COLONOSCOPY  2016   CG-miralax (good)   GYNECOLOGIC CRYOSURGERY  1987   TUBAL LIGATION  1990   WISDOM TOOTH EXTRACTION     Social History: Social History   Socioeconomic History   Marital status: Married    Spouse name: Not on file   Number of children: 0   Years of education: Not on file   Highest education level: Bachelor's degree (e.g., BA, AB, BS)  Occupational History   Occupation: Retired Pension scheme manager  Tobacco Use   Smoking status: Never    Passive exposure: Never   Smokeless tobacco: Never  Vaping Use   Vaping status: Never Used  Substance and Sexual Activity   Alcohol use: Yes    Alcohol/week: 3.0 standard drinks of alcohol    Types: 3 Glasses of wine per week   Drug use: No   Sexual activity: Yes    Partners: Male    Birth control/protection: Post-menopausal, Surgical    Comment: BTL  Other Topics Concern   Not on file  Social History Narrative   Not on file   Social Drivers of Health   Financial Resource Strain: Low Risk  (10/31/2023)   Overall Financial Resource Strain (CARDIA)    Difficulty of  Paying Living Expenses: Not hard at all  Food Insecurity: No Food Insecurity (10/31/2023)   Hunger Vital Sign    Worried About Running Out of Food in the Last Year: Never true    Ran Out of Food in the Last Year: Never true  Transportation Needs: No Transportation Needs (10/31/2023)   PRAPARE - Administrator, Civil Service (Medical): No    Lack of Transportation (Non-Medical): No  Physical Activity: Sufficiently Active (10/31/2023)   Exercise Vital Sign    Days of Exercise per Week: 3 days    Minutes of Exercise per Session: 90 min  Stress: No Stress Concern Present (10/31/2023)   [employer] of Occupational Health - Occupational Stress Questionnaire    Feeling of Stress : Not at all  Social Connections: Moderately Integrated (10/31/2023)   Social Connection and Isolation Panel [NHANES]    Frequency of Communication with Friends and Family: More than three times a week    Frequency of Social Gatherings with Friends and Family: Once a week    Attends Religious Services: Never    Database administrator or Organizations: No    Attends Engineer, structural: More than 4 times per year    Marital Status: Married   Family History: Family History  Problem Relation Age of Onset  Colon polyps Mother 69   Breast cancer Mother 31   Osteoporosis Mother    Colon cancer Mother 84   Other Mother        Dec from blood disorder   Diabetes Father        controlled by diet   Hypertension Father        ?   Heart disease Father        bypass surgery and pacemaker   Kidney disease Father    Diabetes Maternal Grandfather    Breast cancer Paternal Grandmother 84       had mastectomy in 1940 or 1950 ? if secondary to cancer   Colon polyps Paternal Grandfather 68   Colon cancer Paternal Grandfather 51   Colon cancer Cousin        30s   Esophageal cancer Neg Hx    Rectal cancer Neg Hx    Stomach cancer Neg Hx    Allergies: Allergies  Allergen Reactions   Penicillins      RASH   Sulfa Antibiotics    Medications: See med rec.  Review of Systems: No headache, visual changes, nausea, vomiting, diarrhea, constipation, dizziness, abdominal pain, skin rash, fevers, chills, night sweats, swollen lymph nodes, weight loss, chest pain, body aches, joint swelling, muscle aches, shortness of breath, mood changes, visual or auditory hallucinations.  Objective:    BP (!) 145/74 (BP Location: Left Arm, Patient Position: Sitting, Cuff Size: Normal)   Pulse 92   Ht 5\' 2"  (1.575 m)   Wt 160 lb 3.2 oz (72.7 kg)   LMP 07/11/2011   SpO2 95%   BMI 29.30 kg/m   General: Well Developed, well nourished, and in no acute distress. Neuro: Alert and oriented x3, extra-ocular muscles intact, sensation grossly intact. Cranial nerves II through XII are intact, motor, sensory, and coordinative functions are all intact. HEENT: Normocephalic, atraumatic, pupils equal round reactive to light, neck supple, no masses, no lymphadenopathy, thyroid nonpalpable. Oropharynx, nasopharynx, external ear canals are unremarkable. Skin: Warm and dry, no rashes noted.  Cardiac: Regular rate and rhythm, no murmurs rubs or gallops.  Respiratory: Clear to auscultation bilaterally. Not using accessory muscles, speaking in full sentences.  Abdominal: Soft, nontender, nondistended, positive bowel sounds, no masses, no organomegaly.  Musculoskeletal: Shoulder, elbow, wrist, hip, knee, ankle stable, and with full range of motion.  Impression and Recommendations:    Wellness examination Assessment & Plan: Routine HCM labs reviewed. HCM reviewed/discussed. Anticipatory guidance regarding healthy weight, lifestyle and choices given. Recommend healthy diet.  Recommend approximately 150 minutes/week of moderate intensity exercise Recommend regular dental and vision exams Always use seatbelt/lap and shoulder restraints Recommend using smoke alarms and checking batteries at least twice a year Recommend using  sunscreen when outside Discussed immunization recommendations   Elevated blood pressure reading in office without diagnosis of hypertension Assessment & Plan: Blood pressure slightly elevated today Patient denies any issues with sustained elevated blood pressure in the past, does report that she occasionally has high blood pressure readings during visits to the doctor's office Does check her BP at home and home readings mostly WNL Discussed lifestyle modifications, will continue to monitor, no medications needed for this at this time She will return for nurse visit in 1-2 weeks for BP recheck with home blood pressure cuff to compare   Hyperlipidemia, unspecified hyperlipidemia type Assessment & Plan: Reviewed recent labs as well as CAC scoring which was 0. Given this would be reasonable to continue with lifestyle modifications and  holding off on medication She has established with cardiologist and has follow-up arranged. Indicates plan for echo in the future as well   Return in about 1 year (around 10/30/2024) for CPE.   ___________________________________________ Shantea Poulton de [place], MD, ABFM, CAQSM Primary Care and Sports Medicine [facility]

## 2023-10-31 NOTE — Patient Instructions (Signed)
  Medication Instructions:  Your physician recommends that you continue on your current medications as directed. Please refer to the Current Medication list given to you today. --If you need a refill on any your medications before your next appointment, please call your pharmacy first. If no refills are authorized on file call the office.--   Follow-Up: Your next appointment:   Your physician recommends that you schedule a follow-up appointment in: 2 week Nurse visit- Check BP & 1 year Physical  with Dr. de Peru  You will receive a text message or e-mail with a link to a survey about your care and experience with us  today! We would greatly appreciate your feedback!   Thanks for letting us  be apart of your health journey!!  Primary Care and Sports Medicine   Dr. Court Distance Peru   We encourage you to activate your patient portal called "MyChart".  Sign up information is provided on this After Visit Summary.  MyChart is used to connect with patients for Virtual Visits (Telemedicine).  Patients are able to view lab/test results, encounter notes, upcoming appointments, etc.  Non-urgent messages can be sent to your provider as well. To learn more about what you can do with MyChart, please visit --  [email or web address].

## 2023-11-06 ENCOUNTER — Ambulatory Visit (HOSPITAL_COMMUNITY): Attending: Internal Medicine

## 2023-11-06 DIAGNOSIS — I340 Nonrheumatic mitral (valve) insufficiency: Secondary | ICD-10-CM | POA: Insufficient documentation

## 2023-11-06 DIAGNOSIS — Z7189 Other specified counseling: Secondary | ICD-10-CM | POA: Diagnosis not present

## 2023-11-06 DIAGNOSIS — Z136 Encounter for screening for cardiovascular disorders: Secondary | ICD-10-CM | POA: Diagnosis not present

## 2023-11-08 LAB — ECHOCARDIOGRAM COMPLETE
Area-P 1/2: 4.17 cm2
S' Lateral: 2.8 cm

## 2023-11-14 ENCOUNTER — Other Ambulatory Visit: Payer: Self-pay | Admitting: Medical Genetics

## 2023-11-14 ENCOUNTER — Encounter (HOSPITAL_COMMUNITY): Payer: Self-pay

## 2023-11-19 ENCOUNTER — Other Ambulatory Visit (HOSPITAL_COMMUNITY)
Admission: RE | Admit: 2023-11-19 | Discharge: 2023-11-19 | Disposition: A | Discharge status: Home / Self Care | Payer: Self-pay | Source: Ambulatory Visit | Attending: Medical Genetics | Admitting: Medical Genetics

## 2023-11-21 ENCOUNTER — Ambulatory Visit (HOSPITAL_BASED_OUTPATIENT_CLINIC_OR_DEPARTMENT_OTHER): Admitting: *Deleted

## 2023-11-21 ENCOUNTER — Ambulatory Visit: Payer: Self-pay

## 2023-11-21 NOTE — Progress Notes (Signed)
 Patient is in office today for a nurse visit for Blood Pressure Check. Patient blood pressure was 124/72, Patient No chest pain, No shortness of breath, No dyspnea on exertion, No orthopnea, No paroxysmal nocturnal dyspnea, No edema, No palpitations, No syncope  Patient brought bp cuff from home reading on bp cuff was 136/83.

## 2023-11-30 LAB — GENECONNECT MOLECULAR SCREEN: Genetic Analysis Overall Interpretation: NEGATIVE

## 2024-03-06 ENCOUNTER — Encounter (HOSPITAL_BASED_OUTPATIENT_CLINIC_OR_DEPARTMENT_OTHER): Payer: Self-pay | Admitting: Family Medicine

## 2024-03-13 ENCOUNTER — Telehealth (HOSPITAL_BASED_OUTPATIENT_CLINIC_OR_DEPARTMENT_OTHER): Payer: Self-pay | Admitting: *Deleted

## 2024-03-13 NOTE — Telephone Encounter (Signed)
 Please advise if we can send a prescription for the covid vaccine

## 2024-03-13 NOTE — Telephone Encounter (Signed)
 Copied from CRM [id number]. Topic: Clinical - Medication Question >> Mar 13, 2024 11:27 AM Tobias CROME wrote: Reason for CRM: [facility] [address] [place] [address] and was advised she would need a prescription for the covid vaccine ([facility])  Patient requesting Rx for COVID injection [facility] [address] [place] [address].   Patient leaves the country on 03/19/24, requesting for rx to be sent in as soon as possible.

## 2024-03-14 ENCOUNTER — Other Ambulatory Visit (HOSPITAL_BASED_OUTPATIENT_CLINIC_OR_DEPARTMENT_OTHER): Payer: Self-pay | Admitting: *Deleted

## 2024-03-14 ENCOUNTER — Telehealth (HOSPITAL_BASED_OUTPATIENT_CLINIC_OR_DEPARTMENT_OTHER): Payer: Self-pay | Admitting: Family Medicine

## 2024-03-14 MED ORDER — ONDANSETRON 4 MG PO TBDP: 4.0000 mg | ORAL_TABLET | Freq: Three times a day (TID) | ORAL | 0 refills | Status: DC | PRN

## 2024-03-14 MED ORDER — COVID-19 MRNA VACCINE (PFIZER) 30 MCG/0.3ML IM SUSP: 0.3000 mL | Freq: Once | INTRAMUSCULAR | 0 refills | Status: AC

## 2024-03-14 NOTE — Telephone Encounter (Signed)
 Patient advised with verbal understanding

## 2024-03-14 NOTE — Telephone Encounter (Signed)
 Pt called stating she [facility] with the [facility]ist at [facility] for a covid vaccine for traveling and was told her pcp would have to send a prescription for it since it has no been approved or released yet as well as a anti-nausea med for traveling as well she stated she does leave on Wednesday and wants to get it before then please advise

## 2024-07-08 ENCOUNTER — Ambulatory Visit (HOSPITAL_BASED_OUTPATIENT_CLINIC_OR_DEPARTMENT_OTHER)

## 2024-07-15 ENCOUNTER — Encounter (HOSPITAL_BASED_OUTPATIENT_CLINIC_OR_DEPARTMENT_OTHER): Payer: Self-pay

## 2024-07-15 ENCOUNTER — Ambulatory Visit (INDEPENDENT_AMBULATORY_CARE_PROVIDER_SITE_OTHER)

## 2024-07-15 VITALS — Ht 62.0 in | Wt 160.0 lb

## 2024-07-15 DIAGNOSIS — Z0000 Encounter for general adult medical examination without abnormal findings: Secondary | ICD-10-CM | POA: Diagnosis not present

## 2024-07-15 NOTE — Patient Instructions (Signed)
 Grace Morales,  Thank you for taking the time for your Medicare Wellness Visit. I appreciate your continued commitment to your health goals. Please review the care plan we discussed, and feel free to reach out if I can assist you further.  Please note that Annual Wellness Visits do not include a physical exam. Some assessments may be limited, especially if the visit was conducted virtually. If needed, we may recommend an in-person follow-up with your provider.  Ongoing Care Seeing your primary care provider every 3 to 6 months helps us  monitor your health and provide consistent, personalized care. Next office visit on 10/31/2024.  Referrals If a referral was made during today's visit and you haven't received any updates within two weeks, please contact the referred provider directly to check on the status.  Recommended Screenings:  Health Maintenance  Topic Date Due   Flu Shot  02/08/2024   COVID-19 Vaccine (9 - 2025-26 season) 03/10/2024   Medicare Annual Wellness Visit  05/14/2024   Breast Cancer Screening  10/30/2025   Colon Cancer Screening  06/27/2026   DTaP/Tdap/Td vaccine (2 - Td or Tdap) 11/04/2026   Pneumococcal Vaccine for age over 57  Completed   Osteoporosis screening with Bone Density Scan  Completed   Hepatitis C Screening  Completed   Zoster (Shingles) Vaccine  Completed   Meningitis B Vaccine  Aged Out       07/11/2024    3:37 PM  Advanced Directives  Does Patient Have a Medical Advance Directive? Yes  Type of Estate Agent of [place];Living will;Out of facility DNR (pink MOST or yellow form)  Copy of Healthcare Power of Attorney in Chart? No - copy requested    Vision: Annual vision screenings are recommended for early detection of glaucoma, cataracts, and diabetic retinopathy. These exams can also reveal signs of chronic conditions such as diabetes and high blood pressure.  Dental: Annual dental screenings help detect early signs of oral  cancer, gum disease, and other conditions linked to overall health, including heart disease and diabetes.  Please see the attached documents for additional preventive care recommendations.

## 2024-07-15 NOTE — Progress Notes (Signed)
 "  Chief Complaint  Patient presents with   Medicare Wellness     Subjective:   Grace Morales is a 69 y.o. female who presents for a Medicare Annual Wellness Visit.  Visit info / Clinical Intake: Medicare Wellness Visit Mode:: Telephone If telephone:: video declined If Telephone or Video please confirm:: I connected with patient using audio/video enable telemedicine. I verified patient identity with two identifiers, discussed telehealth limitations, and patient agreed to proceed. Patient Location:: Home Provider Location:: Office Interpreter Needed?: No Pre-visit prep was completed: yes AWV questionnaire completed by patient prior to visit?: yes Date:: 07/11/24 Living arrangements:: (Patient-Rptd) lives with spouse/significant other Patient's Overall Health Status Rating: (Patient-Rptd) very good Typical amount of pain: (Patient-Rptd) some Does pain affect daily life?: (Patient-Rptd) no Are you currently prescribed opioids?: no  Dietary Habits and Nutritional Risks How many meals a day?: (Patient-Rptd) 3 Eats fruit and vegetables daily?: (Patient-Rptd) yes Most meals are obtained by: (Patient-Rptd) preparing own meals In the last 2 weeks, have you had any of the following?: none Diabetic:: no  Functional Status Activities of Daily Living (to include ambulation/medication): (Patient-Rptd) Independent Ambulation: (Patient-Rptd) Independent Medication Administration: (Patient-Rptd) Independent Home Management (perform basic housework or laundry): (Patient-Rptd) Independent Manage your own finances?: (Patient-Rptd) yes Primary transportation is: (Patient-Rptd) driving Concerns about vision?: no *vision screening is required for WTM* Concerns about hearing?: no  Fall Screening Falls in the past year?: (Patient-Rptd) 1 Number of falls in past year: (Patient-Rptd) 0 Was there an injury with Fall?: (Patient-Rptd) 1 Fall Risk Category Calculator: (Patient-Rptd) 2 Patient  Fall Risk Level: (Patient-Rptd) Moderate Fall Risk  Fall Risk Patient at Risk for Falls Due to: Impaired balance/gait Fall risk Follow up: Falls evaluation completed; Falls prevention discussed  Home and Transportation Safety: All rugs have non-skid backing?: (Patient-Rptd) yes All stairs or steps have railings?: (Patient-Rptd) yes Grab bars in the bathtub or shower?: (Patient-Rptd) yes Have non-skid surface in bathtub or shower?: (Patient-Rptd) yes Good home lighting?: (Patient-Rptd) yes Regular seat belt use?: (Patient-Rptd) yes Hospital stays in the last year:: (Patient-Rptd) no  Cognitive Assessment Difficulty concentrating, remembering, or making decisions? : (Patient-Rptd) no Will 6CIT or Mini Cog be Completed: no 6CIT or Mini Cog Declined: patient alert, oriented, able to answer questions appropriately and recall recent events  Advance Directives (For Healthcare) Does Patient Have a Medical Advance Directive?: Yes Type of Advance Directive: Healthcare Power of [place]; Living will; Out of facility DNR (pink MOST or yellow form) Copy of Healthcare Power of Attorney in Chart?: No - copy requested Copy of Living Will in Chart?: No - copy requested Out of facility DNR (pink MOST or yellow form) in Chart? (Ambulatory ONLY): No - copy requested  Reviewed/Updated  Reviewed/Updated: Reviewed All (Medical, Surgical, Family, Medications, Allergies, Care Teams, Patient Goals)    Allergies (verified) Penicillins and Sulfa antibiotics   Current Medications (verified) Outpatient Encounter Medications as of 07/15/2024  Medication Sig   buPROPion  (WELLBUTRIN  XL) 150 MG 24 hr tablet TAKE 1 TABLET BY MOUTH EVERY DAY   Cholecalciferol 4000 units CAPS Take 1 capsule by mouth daily.   famotidine (PEPCID) 20 MG tablet Take 20 mg by mouth daily as needed.   fluticasone (FLONASE) 50 MCG/ACT nasal spray Place 2 sprays into both nostrils daily as needed.   ondansetron  (ZOFRAN -ODT) 4 MG  disintegrating tablet Take 1 tablet (4 mg total) by mouth every 8 (eight) hours as needed for nausea or vomiting.   PSYLLIUM PO Take 1 packet by mouth daily. FIBER  temazepam  (RESTORIL ) 15 MG capsule TAKE 1-2 CAPSULES NIGHTLY AS NEEDED FOR SLEEP. *INS COVERS 1.3 CAPS/DAY*   meloxicam (MOBIC) 7.5 MG tablet Take 1 tablet by mouth daily as needed. (Patient not taking: Reported on 07/15/2024)   No facility-administered encounter medications on file as of 07/15/2024.    History: Past Medical History:  Diagnosis Date   Anxiety    situational anxiety   GERD (gastroesophageal reflux disease)    with certain foods-OTC PRN meds   Hx of colonic polyp - sessile serrated polyp 04/06/2015   Hyperlipidemia    diet controlled-no meds   Osteopenia    Seasonal allergies    Spondylolisthesis    Vitamin D  deficiency    Past Surgical History:  Procedure Laterality Date   BREAST EXCISIONAL BIOPSY Right 2002   BREAST LUMPECTOMY Right 2002   Benign excision   COLONOSCOPY  2016   CG-miralax (good)   GYNECOLOGIC CRYOSURGERY  1987   TUBAL LIGATION  1990   WISDOM TOOTH EXTRACTION     Family History  Problem Relation Age of Onset   Colon polyps Mother 37   Breast cancer Mother 70   Osteoporosis Mother    Colon cancer Mother 22   Other Mother        Dec from blood disorder   Diabetes Father        controlled by diet   Hypertension Father        ?   Heart disease Father        bypass surgery and pacemaker   Kidney disease Father    Diabetes Maternal Grandfather    Breast cancer Paternal Grandmother 9       had mastectomy in 47 or 1950 ? if secondary to cancer   Colon polyps Paternal Grandfather 65   Colon cancer Paternal Grandfather 60   Colon cancer Cousin        30s   Esophageal cancer Neg Hx    Rectal cancer Neg Hx    Stomach cancer Neg Hx    Social History   Occupational History   Occupation: Retired pension scheme manager   Occupation: RETIRED  Tobacco Use   Smoking status:  Never    Passive exposure: Never   Smokeless tobacco: Never  Vaping Use   Vaping status: Never Used  Substance and Sexual Activity   Alcohol use: Yes    Alcohol/week: 3.0 standard drinks of alcohol    Types: 3 Glasses of wine per week   Drug use: No   Sexual activity: Yes    Partners: Male    Birth control/protection: Post-menopausal, Surgical    Comment: BTL   Tobacco Counseling Counseling given: Not Answered  SDOH Screenings   Food Insecurity: No Food Insecurity (07/11/2024)  Housing: Unknown (07/11/2024)  Transportation Needs: No Transportation Needs (07/11/2024)  Utilities: Not At Risk (07/15/2024)  Alcohol Screen: Low Risk (07/11/2024)  Depression (PHQ2-9): Low Risk (07/15/2024)  Financial Resource Strain: Low Risk (07/11/2024)  Physical Activity: Sufficiently Active (07/11/2024)  Social Connections: Moderately Integrated (07/11/2024)  Stress: No Stress Concern Present (07/11/2024)  Tobacco Use: Low Risk (07/15/2024)  Health Literacy: Patient Unable To Answer (07/15/2024)   See flowsheets for full screening details  Depression Screen PHQ 2 & 9 Depression Scale- Over the past 2 weeks, how often have you been bothered by any of the following problems? Little interest or pleasure in doing things: 0 Feeling down, depressed, or hopeless (PHQ Adolescent also includes...irritable): 0 PHQ-2 Total Score: 0 Trouble falling or staying  asleep, or sleeping too much: 0 Feeling tired or having little energy: 0 Poor appetite or overeating (PHQ Adolescent also includes...weight loss): 0 Feeling bad about yourself - or that you are a failure or have let yourself or your family down: 0 Trouble concentrating on things, such as reading the newspaper or watching television (PHQ Adolescent also includes...like school work): 0 Moving or speaking so slowly that other people could have noticed. Or the opposite - being so fidgety or restless that you have been moving around a lot more than usual: 0 Thoughts that you  would be better off dead, or of hurting yourself in some way: 0 PHQ-9 Total Score: 0 If you checked off any problems, how difficult have these problems made it for you to do your work, take care of things at home, or get along with other people?: Not difficult at all  Depression Treatment Depression Interventions/Treatment : EYV7-0 Score <4 Follow-up Not Indicated     Goals Addressed               This Visit's Progress     Patient Stated (pt-stated)        To stay healthy and mobile and independent/2026             Objective:    Today's Vitals   07/15/24 1543  Weight: 160 lb (72.6 kg)  Height: 5' 2 (1.575 m)   Body mass index is 29.26 kg/m.  Hearing/Vision screen Hearing Screening - Comments:: Denies hearing difficulties   Vision Screening - Comments:: Denies vision issues./UTD/ Dr. Fleeta  Immunizations and Health Maintenance Health Maintenance  Topic Date Due   Influenza Vaccine  02/08/2024   COVID-19 Vaccine (9 - 2025-26 season) 03/10/2024   Medicare Annual Wellness (AWV)  07/15/2025   Mammogram  10/30/2025   Colonoscopy  06/27/2026   DTaP/Tdap/Td (2 - Td or Tdap) 11/04/2026   Pneumococcal Vaccine: 50+ Years  Completed   Bone Density Scan  Completed   Hepatitis C Screening  Completed   Zoster Vaccines- Shingrix  Completed   Meningococcal B Vaccine  Aged Out        Assessment/Plan:  This is a routine wellness examination for Sylvarena.  Patient Care Team: de Cuba, Quintin PARAS, MD as PCP - General (Family Medicine) Verlin Lonni BIRCH, MD as PCP - Cardiology (Cardiology) Fleeta Zerita DASEN, MD as Consulting Physician (Ophthalmology)  I have personally reviewed and noted the following in the patients chart:   Medical and social history Use of alcohol, tobacco or illicit drugs  Current medications and supplements including opioid prescriptions. Functional ability and status Nutritional status Physical activity Advanced directives List of other  physicians Hospitalizations, surgeries, and ER visits in previous 12 months Vitals Screenings to include cognitive, depression, and falls Referrals and appointments  No orders of the defined types were placed in this encounter.  In addition, I have reviewed and discussed with patient certain preventive protocols, quality metrics, and best practice recommendations. A written personalized care plan for preventive services as well as general preventive health recommendations were provided to patient.   Jancy Sprankle L Kayti Poss, CMA   07/15/2024   Return in 1 year (on 07/15/2025).  After Visit Summary: (MyChart) Due to this being a telephonic visit, the after visit summary with patients personalized plan was offered to patient via MyChart   Nurse Notes: No voiced or noted concerns at this time. "

## 2024-07-16 NOTE — Progress Notes (Signed)
 "  ANNUAL EXAM Patient name: Grace Morales MRN [id number]  Date of birth: 02-Aug-1955 Chief Complaint:   Gynecologic Exam  History of Present Illness:   Grace Morales is a 69 y.o. G0P0 Caucasian female being seen today for a routine annual exam.  Doing well.  No vaginal bleeding.  Does have some insomnia.  Uses temazepam  occasionally.  Would liked RF this year.  Also needs RF for wellbutrin .    Patient's last menstrual period was 07/11/2011.   Last pap 04/04/2021. Results were: NILM w/ HRHPV not done. H/O abnormal pap: yes Last mammogram: 10/31/2023. Results were: normal. Family h/o breast cancer: yes paternal grandmother Last colonoscopy: 06/27/2021. Results were: abnormal two polyps. Family h/o colorectal cancer: yes mother and paternal grandfather.  Follow up 5 years.   Dexa:  ordered     07/15/2024    3:57 PM 10/31/2023    3:42 PM 07/17/2023    2:28 PM 05/15/2023    2:03 PM 10/26/2022    1:48 PM  Depression screen PHQ 2/9  Decreased Interest 0 0 0 0 0  Down, Depressed, Hopeless 0 0 0 0 0  PHQ - 2 Score 0 0 0 0 0  Altered sleeping 0 0  0   Tired, decreased energy 0 0  0   Change in appetite 0 0  0   Feeling bad or failure about yourself  0 0  0   Trouble concentrating 0 0  0   Moving slowly or fidgety/restless 0 0  0   Suicidal thoughts 0 0  0   PHQ-9 Score 0 0   0    Difficult doing work/chores Not difficult at all Not difficult at all  Not difficult at all      Data saved with a previous flowsheet row definition        10/31/2023    3:43 PM 10/26/2022    1:48 PM  GAD 7 : Generalized Anxiety Score  Nervous, Anxious, on Edge 0 0  Control/stop worrying 0 0  Worry too much - different things 0 0  Trouble relaxing 0 0  Restless 0 0  Easily annoyed or irritable 0 0  Afraid - awful might happen 0 0  Total GAD 7 Score 0 0  Anxiety Difficulty Not difficult at all Not difficult at all     Review of Systems:   Pertinent items are noted in HPI Denies any  headaches, blurred vision, fatigue, shortness of breath, chest pain, abdominal pain, abnormal vaginal discharge/itching/odor/irritation, problems with periods, bowel movements, urination, or intercourse unless otherwise stated above. Pertinent History Reviewed:  Reviewed past medical,surgical, social and family history.  Reviewed problem list, medications and allergies. Physical Assessment:   Vitals:   07/17/24 1417  BP: 129/89  Pulse: 96  SpO2: 100%  Weight: 161 lb (73 kg)  Height: 5' 3 (1.6 m)  Body mass index is 28.52 kg/m.        Physical Examination:   General appearance - well appearing, and in no distress  Mental status - alert, oriented to person, place, and time  Psych:  She has a normal mood and affect  Skin - warm and dry, normal color, no suspicious lesions noted  Chest - effort normal, all lung fields clear to auscultation bilaterally  Heart - normal rate and regular rhythm  Neck:  midline trachea, no thyromegaly or nodules  Breasts - breasts appear normal, no suspicious masses, no skin or nipple changes or  axillary nodes  Abdomen - soft, nontender, nondistended, no masses or organomegaly  Pelvic - VULVA: normal appearing vulva with no masses, tenderness or lesions   VAGINA: normal appearing vagina with normal color and discharge, no lesions   CERVIX: normal appearing cervix without discharge or lesions, no CMT  Thin prep pap is updated today  UTERUS: uterus is felt to be normal size, shape, consistency and nontender   ADNEXA: No adnexal masses or tenderness noted.  Rectal - normal rectal, good sphincter tone, no masses felt.   Extremities:  No swelling or varicosities noted  Chaperone present for exam  No results found for this or any previous visit (from the past 24 hours).  Assessment & Plan:  1. Encntr for gyn exam (general) (routine) w/o abn findings (Primary) - Pap smear updated today - Mammogram 10/2023.   - Colonoscopy 2022.  Due next year - Bone  mineral density ordered - lab work done with PCP, Dr. De [place] - vaccines reviewed/updated  2. Family history of breast cancer - MM 2D DIAG BILAT WITH CONTRAST BCG ONLY; Future - given family hx, pt has done MRI in past.  Discussed CEM, pros/cons.  Will proceed with this imaging around April  3. Cervical cancer screening - Cytology - PAP( [facility]) - PR OBTAINING SCREEN PAP SMEAR  4. Osteopenia of both forearms - she is taking Vit D - DG Bone Density; Future  5. Primary insomnia - temazepam  (RESTORIL ) 15 MG capsule; TAKE 1-2 CAPSULES NIGHTLY AS NEEDED FOR SLEEP. *INS COVERS 1.3 CAPS/DAY*  Dispense: 30 capsule; Refill: 0  6. Other insomnia - temazepam  (RESTORIL ) 15 MG capsule; TAKE 1-2 CAPSULES NIGHTLY AS NEEDED FOR SLEEP. *INS COVERS 1.3 CAPS/DAY*  Dispense: 30 capsule; Refill: 0  7. History of depression - buPROPion  (WELLBUTRIN  XL) 150 MG 24 hr tablet; Take 1 tablet (150 mg total) by mouth daily.  Dispense: 90 tablet; Refill: 3   Orders Placed This Encounter  Procedures   MM 2D DIAG BILAT WITH CONTRAST BCG ONLY   DG Bone Density   PR OBTAINING SCREEN PAP SMEAR    Meds:  Meds ordered this encounter  Medications   temazepam  (RESTORIL ) 15 MG capsule    Sig: TAKE 1-2 CAPSULES NIGHTLY AS NEEDED FOR SLEEP. *INS COVERS 1.3 CAPS/DAY*    Dispense:  30 capsule    Refill:  0    Not to exceed 5 additional fills before 11/21/2022   buPROPion  (WELLBUTRIN  XL) 150 MG 24 hr tablet    Sig: Take 1 tablet (150 mg total) by mouth daily.    Dispense:  90 tablet    Refill:  3    Follow-up: Return in about 1 year (around 07/17/2025).  Ronal GORMAN Pinal, MD 07/17/2024 2:58 PM "

## 2024-07-17 ENCOUNTER — Encounter (HOSPITAL_BASED_OUTPATIENT_CLINIC_OR_DEPARTMENT_OTHER): Payer: Self-pay | Admitting: Obstetrics & Gynecology

## 2024-07-17 ENCOUNTER — Ambulatory Visit (INDEPENDENT_AMBULATORY_CARE_PROVIDER_SITE_OTHER): Payer: Medicare PPO | Admitting: Obstetrics & Gynecology

## 2024-07-17 ENCOUNTER — Other Ambulatory Visit (HOSPITAL_COMMUNITY)
Admission: RE | Admit: 2024-07-17 | Discharge: 2024-07-17 | Disposition: A | Source: Ambulatory Visit | Attending: Obstetrics & Gynecology | Admitting: Obstetrics & Gynecology

## 2024-07-17 VITALS — BP 129/89 | HR 96 | Ht 63.0 in | Wt 161.0 lb

## 2024-07-17 DIAGNOSIS — F5101 Primary insomnia: Secondary | ICD-10-CM

## 2024-07-17 DIAGNOSIS — M85831 Other specified disorders of bone density and structure, right forearm: Secondary | ICD-10-CM | POA: Diagnosis not present

## 2024-07-17 DIAGNOSIS — Z8659 Personal history of other mental and behavioral disorders: Secondary | ICD-10-CM

## 2024-07-17 DIAGNOSIS — Z1331 Encounter for screening for depression: Secondary | ICD-10-CM

## 2024-07-17 DIAGNOSIS — Z9189 Other specified personal risk factors, not elsewhere classified: Secondary | ICD-10-CM

## 2024-07-17 DIAGNOSIS — M85832 Other specified disorders of bone density and structure, left forearm: Secondary | ICD-10-CM | POA: Diagnosis not present

## 2024-07-17 DIAGNOSIS — Z803 Family history of malignant neoplasm of breast: Secondary | ICD-10-CM

## 2024-07-17 DIAGNOSIS — Z124 Encounter for screening for malignant neoplasm of cervix: Secondary | ICD-10-CM

## 2024-07-17 DIAGNOSIS — Z01419 Encounter for gynecological examination (general) (routine) without abnormal findings: Secondary | ICD-10-CM | POA: Diagnosis not present

## 2024-07-17 DIAGNOSIS — G4709 Other insomnia: Secondary | ICD-10-CM

## 2024-07-17 MED ORDER — TEMAZEPAM 15 MG PO CAPS: ORAL_CAPSULE | ORAL | 0 refills | Status: AC

## 2024-07-17 MED ORDER — BUPROPION HCL ER (XL) 150 MG PO TB24: 150.0000 mg | ORAL_TABLET | Freq: Every day | ORAL | 3 refills | Status: AC

## 2024-07-17 NOTE — Patient Instructions (Addendum)
 Bone Density:  Call [phone] to schedule an appointment at [facility].    For contrast enhanced mammogram, call [phone] to scheduled at the [facility], [address], [address].  [place], [place] [address].  You should call the month before it's due-- March.  Your last mammogram was 10/31/2023.

## 2024-07-21 LAB — CYTOLOGY - PAP
Comment: NEGATIVE
Diagnosis: NEGATIVE
High risk HPV: NEGATIVE

## 2024-07-23 ENCOUNTER — Ambulatory Visit (HOSPITAL_BASED_OUTPATIENT_CLINIC_OR_DEPARTMENT_OTHER): Payer: Self-pay | Admitting: Obstetrics & Gynecology

## 2024-10-27 ENCOUNTER — Ambulatory Visit (HOSPITAL_BASED_OUTPATIENT_CLINIC_OR_DEPARTMENT_OTHER)

## 2024-10-31 ENCOUNTER — Encounter (HOSPITAL_BASED_OUTPATIENT_CLINIC_OR_DEPARTMENT_OTHER): Admitting: Family Medicine
